# Patient Record
Sex: Female | Born: 1990 | Race: White | Hispanic: No | Marital: Single | Smoking: Never smoker
Health system: Southern US, Community
[De-identification: ages and names within clinical notes are randomized; demographics above are authoritative.]

## PROBLEM LIST (undated history)

## (undated) ENCOUNTER — Inpatient Hospital Stay (HOSPITAL_COMMUNITY): Payer: Self-pay

## (undated) DIAGNOSIS — N200 Calculus of kidney: Secondary | ICD-10-CM

## (undated) DIAGNOSIS — I499 Cardiac arrhythmia, unspecified: Secondary | ICD-10-CM

## (undated) DIAGNOSIS — Z8619 Personal history of other infectious and parasitic diseases: Secondary | ICD-10-CM

## (undated) DIAGNOSIS — E739 Lactose intolerance, unspecified: Secondary | ICD-10-CM

## (undated) DIAGNOSIS — R87629 Unspecified abnormal cytological findings in specimens from vagina: Secondary | ICD-10-CM

## (undated) DIAGNOSIS — IMO0002 Reserved for concepts with insufficient information to code with codable children: Secondary | ICD-10-CM

## (undated) HISTORY — DX: Personal history of other infectious and parasitic diseases: Z86.19

## (undated) HISTORY — PX: WISDOM TOOTH EXTRACTION: SHX21

## (undated) HISTORY — DX: Lactose intolerance, unspecified: E73.9

## (undated) HISTORY — DX: Reserved for concepts with insufficient information to code with codable children: IMO0002

## (undated) HISTORY — DX: Cardiac arrhythmia, unspecified: I49.9

## (undated) HISTORY — DX: Unspecified abnormal cytological findings in specimens from vagina: R87.629

---
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
## 2010-08-14 ENCOUNTER — Emergency Department (HOSPITAL_BASED_OUTPATIENT_CLINIC_OR_DEPARTMENT_OTHER): Admission: EM | Admit: 2010-08-14 | Discharge: 2010-08-14 | Payer: Self-pay | Admitting: Emergency Medicine

## 2011-02-21 LAB — URINALYSIS, ROUTINE W REFLEX MICROSCOPIC
Bilirubin Urine: NEGATIVE
Glucose, UA: NEGATIVE mg/dL
Ketones, ur: 15 mg/dL — AB
Protein, ur: 30 mg/dL — AB

## 2011-02-21 LAB — URINE MICROSCOPIC-ADD ON

## 2011-04-23 NOTE — Letter (Signed)
September 29, 2008    C. Duane Lope, MD  [address]  [place], [place] [address]   RE:  Rebecca Christensen  MRN:  [id number]  /  DOB:  [birth year]   Dear Hessie Diener:   It was a pleasure to see Rebecca Christensen today at your request and I am  sorry I did not get a chance to speak with you about her, but I will  afford to do that tomorrow.   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  One of these episodes recently are brought her to your office; the  tachycardia.  Unfortunately, terminated just prior to being brought  back.  An electrocardiogram we have from your office demonstrated sinus  rhythm; however, what she describes is abrupt in onset and offset that  lasted on this occasion 20-30 minutes.   It has most severe symptoms at this initiation with lightheadedness and  presyncope whether she is sitting and/or standing.  Symptoms abate  gradually over the duration.  The frequency of these episodes has  increased from initially an interval of couple of years to now only a  couple of months.  I should note that they are frog negative and  diuretic negative.   She does not have any symptoms of orthostatic intolerance, heat  intolerance, or shower intolerance to suggest a dysautonomic process.   PAST MEDICAL HISTORY:  Notable for sweating in her hands and her feet  for which he takes Robinul Forte.  She does not take in a great deal of  liquids during the day.  Her review of systems is otherwise broadly  negative.   She is unmarried.  She has an older brother.  She does not use  cigarettes, alcohol, or recreational drugs.  She does use some caffeine,  but has tried to cut back on this since she saw you.   FAMILY HISTORY:  Noncontributory.   PHYSICAL EXAMINATION:  GENERAL:  She is an attractive young lady who  appears at her stated age of 20.  VITAL SIGNS:  Her blood pressure is 100/70.  Her pulse was 80.  There is  no orthostatic change with prolonged standing.  HEENT:  No icterus, no xanthoma.  NECK:  Veins were flat.  The carotids are brisk and full bilaterally  without bruits.  BACK:  Without kyphosis, scoliosis.  LUNGS:  Clear.  HEART:  Sounds were regular without murmurs or gallops.  ABDOMEN:  Soft with active bowel sounds without midline pulsation or  hepatomegaly.  Femoral pulses were 2+, distal pulses were intact.  There  is no clubbing, cyanosis, or edema.  NEUROLOGICAL:  Grossly normal.  SKIN:  Warm and dry.   Electrocardiogram dated today demonstrated sinus rhythm of 80 with  intervals of 0.12/0.0 9/0.37, the axis was 71 degrees.   IMPRESSION:  1. 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  2. Short PR interval without evidence of ventricular pre-excitation.  3. Presyncope associated with supraventricular tachycardia.   Rebecca Christensen probably has SVT as a cause of her symptoms and probably AV  reentry.  We concealed accessory pathway.  Notwithstanding her short PR,  there is no evidence of ventricular pre-excitation which almost always  is associated with a positive delta wave in  lead V4.  The absence of the  delta wave is important in terms of associated risks of sudden cardiac  death that are known to accompany WPW.   The other major issue here is the profound symptoms of presyncope that  she has whether she is standing or sitting, the latter being relevant to  driving.  Symptoms are worse typically at the onset because of  A.  Brief relative decrease in venous return resulting in modest under  filling, resulting in decreased cardiac output.  B.  Secondary neurally mediated vaso dilatation.  Her volume deplete  diet may help remediate some of that, but I still am concerned about the  frequency of her symptoms and the degree of  lightheadedness that  accompanies the onset.   We discussed treatment options including doing nothing apart from fluid  resuscitation.  1. Doing nothing apart from extra fluid.  2. P.r.n. or daily medications, the former making not a lot of sense      giving the severity of her symptoms are worse at onset and the      latter being some concern because of a still relative infrequency.  3. Catheter ablation with EP testing.  We discussed the potential      benefits and risks of this including, but not limited to death,      perforation, and heart block requiring pacemaker implantation.   We gave them information from the [employer] [facility] to  review as a family and we will plan to sit down with them again in about  2-3 months' time to see how it is that she is doing with the treatment  plan.   Thanks very much for asking [place] to see her.    Sincerely,      Duke Salvia, MD, [facility]  Electronically Signed    SCK/MedQ  DD: 09/29/2008  DT: 09/30/2008  Job #: [id number]

## 2011-07-11 LAB — GC/CHLAMYDIA PROBE AMP, GENITAL

## 2011-07-11 LAB — RUBELLA ANTIBODY, IGM: Rubella: IMMUNE

## 2011-09-09 ENCOUNTER — Inpatient Hospital Stay (HOSPITAL_COMMUNITY)
Admission: AD | Admit: 2011-09-09 | Discharge: 2011-09-09 | Disposition: A | Discharge status: Home / Self Care | Payer: Medicaid Other | Source: Ambulatory Visit | Attending: Obstetrics and Gynecology | Admitting: Obstetrics and Gynecology

## 2011-09-09 ENCOUNTER — Inpatient Hospital Stay (HOSPITAL_COMMUNITY): Payer: Medicaid Other

## 2011-09-09 ENCOUNTER — Encounter (HOSPITAL_COMMUNITY): Payer: Self-pay | Admitting: *Deleted

## 2011-09-09 DIAGNOSIS — R109 Unspecified abdominal pain: Secondary | ICD-10-CM | POA: Insufficient documentation

## 2011-09-09 DIAGNOSIS — R319 Hematuria, unspecified: Secondary | ICD-10-CM | POA: Insufficient documentation

## 2011-09-09 DIAGNOSIS — N200 Calculus of kidney: Secondary | ICD-10-CM

## 2011-09-09 DIAGNOSIS — O99891 Other specified diseases and conditions complicating pregnancy: Secondary | ICD-10-CM | POA: Insufficient documentation

## 2011-09-09 HISTORY — PX: RENAL ARTERY STENT: SHX2321

## 2011-09-09 HISTORY — DX: Calculus of kidney: N20.0

## 2011-09-09 LAB — URINALYSIS, ROUTINE W REFLEX MICROSCOPIC
Leukocytes, UA: NEGATIVE
Nitrite: NEGATIVE
Protein, ur: NEGATIVE mg/dL
Specific Gravity, Urine: >1.03 — ABNORMAL HIGH (ref 1.005–1.030)
Urobilinogen, UA: 0.2 mg/dL (ref 0.0–1.0)

## 2011-09-09 LAB — URINE MICROSCOPIC-ADD ON

## 2011-09-09 MED ORDER — OXYCODONE-ACETAMINOPHEN 5-325 MG PO TABS
2.0000 | ORAL_TABLET | Freq: Once | ORAL | Status: AC
  Administered 2011-09-09: 2 via ORAL
  Filled 2011-09-09: qty 2

## 2011-09-09 MED ORDER — HYDROMORPHONE HCL 1 MG/ML IJ SOLN
1.0000 mg | Freq: Once | INTRAMUSCULAR | Status: AC
  Administered 2011-09-09: 1 mg via INTRAVENOUS
  Filled 2011-09-09: qty 1

## 2011-09-09 MED ORDER — ONDANSETRON HCL 4 MG/2ML IJ SOLN
4.0000 mg | Freq: Once | INTRAMUSCULAR | Status: AC
  Administered 2011-09-09: 4 mg via INTRAVENOUS
  Filled 2011-09-09: qty 2

## 2011-09-09 MED ORDER — LACTATED RINGERS IV SOLN
INTRAVENOUS | Status: DC
  Administered 2011-09-09: 06:00:00 via INTRAVENOUS

## 2011-09-09 MED ORDER — OXYCODONE-ACETAMINOPHEN 5-325 MG PO TABS: 1.0000 | ORAL_TABLET | ORAL | Status: AC | PRN

## 2011-09-09 NOTE — Progress Notes (Signed)
Pt states she feels like she has a kidney stone

## 2011-09-09 NOTE — ED Provider Notes (Signed)
History     Chief Complaint  Patient presents with  . Abdominal Pain   HPI  Pt is [redacted] weeks pregnant and woke up at 1 am with right flank pain like she has a kidney stone.  At 3:30am she started vomiting. Her pain is 9/10.  Pt had a kidneystone a year ago and was seen at [facility] on 68- she was able to pass it on her own.  She says she normally has kidney stones on her left side.    Past Medical History  Diagnosis Date  . Kidney stones     No past surgical history on file.  Family History  Problem Relation Age of Onset  . Hyperlipidemia Mother     History  Substance Use Topics  . Smoking status: Never Smoker   . Smokeless tobacco: Never Used  . Alcohol Use: No    Allergies: No Known Allergies  Prescriptions prior to admission  Medication Sig Dispense Refill  . prenatal vitamin w/FE, FA (PRENATAL 1 + 1) 27-1 MG TABS Take 1 tablet by mouth daily.          Review of Systems  Constitutional: Negative for fever and chills.  Gastrointestinal: Positive for nausea and vomiting.  Genitourinary: Positive for flank pain. Negative for dysuria and urgency.   Physical Exam   Blood pressure 93/56, pulse 87, temperature 98.4 F (36.9 C), temperature source Oral, resp. rate 16, height 5\' 1"  (1.549 m), weight 117 lb (53.071 kg).  Physical Exam  Vitals reviewed. Constitutional: She appears well-developed and well-nourished. She appears distressed.  HENT:  Head: Normocephalic.  Eyes: Pupils are equal, round, and reactive to light.  Neck: Normal range of motion. Neck supple.  Respiratory: Effort normal.  GI: Soft. There is CVA tenderness.       Right CVA tenderness     MAU Course  Procedures IV hydration with LR Pain med with IV Dilaudid 1 mg Antiemetic Zofran 4mg  IV Pt's pain went down to 1/10 and nausea almost completely gone Dr. Arelia Sneddon consulted- will get renal ultrasound Urinalysis showed hematuria and calcium oxylate crystals  Care handed over to  [facility], [facility] Assessment and Plan    LINEBERRY,SUSAN 09/09/2011, 7:21 AM

## 2011-09-09 NOTE — ED Provider Notes (Signed)
History   I have assumed care of this pt from Pamelia Hoit, FNP. Please see previous H&P. Pt with suspected renal calculi.  Chief Complaint  Patient presents with  . Abdominal Pain   HPI  OB History    Grav Para Term Preterm Abortions TAB SAB Ect Mult Living   1               Past Medical History  Diagnosis Date  . Kidney stones     No past surgical history on file.  Family History  Problem Relation Age of Onset  . Hyperlipidemia Mother     History  Substance Use Topics  . Smoking status: Never Smoker   . Smokeless tobacco: Never Used  . Alcohol Use: No    Allergies: No Known Allergies  Prescriptions prior to admission  Medication Sig Dispense Refill  . prenatal vitamin w/FE, FA (PRENATAL 1 + 1) 27-1 MG TABS Take 1 tablet by mouth daily.          ROS Physical Exam   Blood pressure 100/62, pulse 83, temperature 99.1 F (37.3 C), temperature source Oral, resp. rate 16, height 5\' 1"  (1.549 m), weight 117 lb (53.071 kg).  Physical Exam  MAU Course  Procedures  Results for orders placed during the hospital encounter of 09/09/11 (from the past 24 hour(s))  URINALYSIS, ROUTINE W REFLEX MICROSCOPIC     Status: Abnormal   Collection Time   09/09/11  5:10 AM      Component Value Range   Color, Urine YELLOW  YELLOW    Appearance HAZY (*) CLEAR    Specific Gravity, Urine >1.030 (*) 1.005 - 1.030    pH 6.0  5.0 - 8.0    Glucose, UA NEGATIVE  NEGATIVE (mg/dL)   Hgb urine dipstick LARGE (*) NEGATIVE    Bilirubin Urine NEGATIVE  NEGATIVE    Ketones, ur NEGATIVE  NEGATIVE (mg/dL)   Protein, ur NEGATIVE  NEGATIVE (mg/dL)   Urobilinogen, UA 0.2  0.0 - 1.0 (mg/dL)   Nitrite NEGATIVE  NEGATIVE    Leukocytes, UA NEGATIVE  NEGATIVE   URINE MICROSCOPIC-ADD ON     Status: Abnormal   Collection Time   09/09/11  5:10 AM      Component Value Range   Squamous Epithelial / LPF FEW (*) RARE    WBC, UA 0-2  <3 (WBC/hpf)   RBC / HPF 11-20  <3 (RBC/hpf)   Bacteria, UA RARE   RARE    Crystals CA OXALATE CRYSTALS (*) NEGATIVE    US Renal  09/09/2011  *RADIOLOGY REPORT*  Clinical Data:  Right flank pain.  Urolithiasis.  [redacted] weeks pregnant.  RENAL/URINARY TRACT ULTRASOUND COMPLETE  Comparison:  None.  Findings:  Right Kidney:  Normal in size and parenchymal echogenicity.  No evidence of mass or hydronephrosis.  Left Kidney:  Normal in size and parenchymal echogenicity.  No evidence of mass or hydronephrosis.  Bladder:  Appears normal for degree of bladder distention.  IMPRESSION: Normal study.  No evidence of hydronephrosis or other significant abnormality.  Original Report Authenticated By: Danae Orleans, M.D.   Discussed pt with Dr. Vincente Poli at length. Will dc to home with Rx for pain meds and give urine strainer. She is to f/u in office.  Assessment and Plan  Rt flank pain/hematuria: pt likely with renal calculi. No sign of obstruction at this time. Will dc to home with Rx for percocet and flomax. She will f/u with Dr. Lynnell Dike office this week.  Discussed diet, activity, risks, and precautions.  Clinton Gallant. Rice III, DrHSc, MPAS, PA-C  09/09/2011, 8:16 AM   Henrietta Hoover, PA 09/09/11 0825

## 2011-09-26 ENCOUNTER — Encounter (HOSPITAL_COMMUNITY): Payer: Self-pay | Admitting: *Deleted

## 2011-09-26 ENCOUNTER — Inpatient Hospital Stay (HOSPITAL_COMMUNITY)
Admission: AD | Admit: 2011-09-26 | Discharge: 2011-09-26 | Disposition: A | Discharge status: Home / Self Care | Payer: Medicaid Other | Source: Ambulatory Visit | Attending: Obstetrics and Gynecology | Admitting: Obstetrics and Gynecology

## 2011-09-26 DIAGNOSIS — O99891 Other specified diseases and conditions complicating pregnancy: Secondary | ICD-10-CM | POA: Insufficient documentation

## 2011-09-26 DIAGNOSIS — N200 Calculus of kidney: Secondary | ICD-10-CM | POA: Insufficient documentation

## 2011-09-26 DIAGNOSIS — R109 Unspecified abdominal pain: Secondary | ICD-10-CM | POA: Insufficient documentation

## 2011-09-26 LAB — URINALYSIS, ROUTINE W REFLEX MICROSCOPIC
Bilirubin Urine: NEGATIVE
Leukocytes, UA: NEGATIVE
Nitrite: NEGATIVE
Specific Gravity, Urine: 1.025 (ref 1.005–1.030)
pH: 6 (ref 5.0–8.0)

## 2011-09-26 MED ORDER — BUTORPHANOL TARTRATE 2 MG/ML IJ SOLN
1.0000 mg | Freq: Once | INTRAMUSCULAR | Status: AC
  Administered 2011-09-26: 1 mg via INTRAMUSCULAR
  Filled 2011-09-26: qty 1

## 2011-09-26 MED ORDER — OXYCODONE-ACETAMINOPHEN 5-325 MG PO TABS: 1.0000 | ORAL_TABLET | ORAL | Status: AC | PRN

## 2011-09-26 MED ORDER — PROMETHAZINE HCL 25 MG/ML IJ SOLN
25.0000 mg | Freq: Once | INTRAMUSCULAR | Status: AC
  Administered 2011-09-26: 25 mg via INTRAMUSCULAR
  Filled 2011-09-26: qty 1

## 2011-09-26 NOTE — Progress Notes (Signed)
Pt denies vaginal bleeding but states she had bleed after she passed first stone. Pt denies pain on urination.Pt states she has pain in left side.

## 2011-09-26 NOTE — Progress Notes (Signed)
Pt states she woke up at 0600 and past first stone this AM, pasted a second stone at 900

## 2011-09-26 NOTE — ED Provider Notes (Signed)
History   Pt presents today c/o passing multiple renal calculi. She states she has passed at least 2 stones today and is having left flank pain that comes in waves. She denies vag bleeding, dc, fever. She does report some hematuria immediately following passing the stones. She reports GFM.  Chief Complaint  Patient presents with  . Nephrolithiasis   HPI  OB History    Grav Para Term Preterm Abortions TAB SAB Ect Mult Living   1               Past Medical History  Diagnosis Date  . Kidney stones     History reviewed. No pertinent past surgical history.  Family History  Problem Relation Age of Onset  . Hyperlipidemia Mother     History  Substance Use Topics  . Smoking status: Never Smoker   . Smokeless tobacco: Never Used  . Alcohol Use: No    Allergies: No Known Allergies  Prescriptions prior to admission  Medication Sig Dispense Refill  . prenatal vitamin w/FE, FA (PRENATAL 1 + 1) 27-1 MG TABS Take 1 tablet by mouth daily.          Review of Systems  Constitutional: Negative for fever.  Eyes: Negative for blurred vision.  Cardiovascular: Negative for chest pain and palpitations.  Gastrointestinal: Positive for nausea, vomiting and abdominal pain. Negative for diarrhea, constipation and blood in stool.  Genitourinary: Positive for hematuria and flank pain. Negative for dysuria, urgency and frequency.  Neurological: Negative for dizziness and headaches.  Psychiatric/Behavioral: Negative for depression and suicidal ideas.   Physical Exam   Blood pressure 109/61, pulse 110, temperature 98 F (36.7 C), temperature source Oral, resp. rate 18.  Physical Exam  Constitutional: She is oriented to person, place, and time. She appears well-developed and well-nourished. No distress.  HENT:  Head: Normocephalic and atraumatic.  Eyes: EOM are normal. Pupils are equal, round, and reactive to light.  GI: Soft. She exhibits no distension. There is no tenderness. There is no  rebound and no guarding.  Neurological: She is alert and oriented to person, place, and time.  Skin: Skin is warm and dry. She is not diaphoretic.  Psychiatric: She has a normal mood and affect. Her behavior is normal. Judgment and thought content normal.    MAU Course  Procedures  Dr. Marcelle Overlie called. He states he will schedule her to have f/u with urology.    Assessment and Plan  Renal calculi: discussed with pt at length. Will give Rx for percocet. Discussed diet, activity, risks, and precautions. She has f/u scheduled.  Clinton Gallant. Khyan Oats III, DrHSc, MPAS, PA-C  09/26/2011, 3:28 PM   Henrietta Hoover, PA 09/26/11 1651

## 2011-09-28 LAB — URINE CULTURE: Culture  Setup Time: 201210190155

## 2011-09-29 ENCOUNTER — Emergency Department (HOSPITAL_COMMUNITY): Payer: Medicaid Other

## 2011-09-29 ENCOUNTER — Observation Stay (HOSPITAL_COMMUNITY)
Admission: EM | Admit: 2011-09-29 | Discharge: 2011-09-30 | Disposition: A | Discharge status: Home / Self Care | Payer: Medicaid Other | Attending: Urology | Admitting: Urology

## 2011-09-29 DIAGNOSIS — N1330 Unspecified hydronephrosis: Secondary | ICD-10-CM | POA: Insufficient documentation

## 2011-09-29 DIAGNOSIS — O26839 Pregnancy related renal disease, unspecified trimester: Principal | ICD-10-CM | POA: Insufficient documentation

## 2011-09-29 DIAGNOSIS — N200 Calculus of kidney: Secondary | ICD-10-CM | POA: Insufficient documentation

## 2011-09-29 LAB — DIFFERENTIAL
Basophils Absolute: 0 10*3/uL (ref 0.0–0.1)
Eosinophils Absolute: 0.1 10*3/uL (ref 0.0–0.7)
Eosinophils Relative: 1 % (ref 0–5)
Monocytes Absolute: 0.9 10*3/uL (ref 0.1–1.0)

## 2011-09-29 LAB — CBC
MCHC: 35.1 g/dL (ref 30.0–36.0)
MCV: 86.7 fL (ref 78.0–100.0)
Platelets: 219 10*3/uL (ref 150–400)
RDW: 13 % (ref 11.5–15.5)
WBC: 10 10*3/uL (ref 4.0–10.5)

## 2011-09-29 LAB — BASIC METABOLIC PANEL
Chloride: 105 mEq/L (ref 96–112)
Creatinine, Ser: 0.93 mg/dL (ref 0.50–1.10)
GFR calc Af Amer: >90 mL/min (ref >90)
GFR calc non Af Amer: 87 mL/min — ABNORMAL LOW (ref >90)
Potassium: 4.3 mEq/L (ref 3.5–5.1)

## 2011-09-29 LAB — URINALYSIS, ROUTINE W REFLEX MICROSCOPIC
Glucose, UA: NEGATIVE mg/dL
Specific Gravity, Urine: 1.014 (ref 1.005–1.030)
pH: 6.5 (ref 5.0–8.0)

## 2011-09-29 LAB — URINE MICROSCOPIC-ADD ON

## 2011-10-03 NOTE — Op Note (Signed)
  Rebecca Christensen, Rebecca Christensen               ACCOUNT NO.:  [id number]  MEDICAL RECORD NO.:  [id number]  LOCATION:  1430                         FACILITY:  [facility]  PHYSICIAN:  Danae Chen, M.D.  DATE OF BIRTH:  [birth year]  DATE OF PROCEDURE:  09/29/2011 DATE OF DISCHARGE:                              OPERATIVE REPORT   PREOPERATIVE DIAGNOSIS:  Bilateral nephrolithiasis.  POSTOPERATIVE DIAGNOSIS:  Bilateral nephrolithiasis.  PROCEDURE DONE:  Cystoscopy and insertion of bilateral double-J stent.  SURGEON:  Danae Chen, M.D.  ANESTHESIA:  Spinal.  DATE OF PROCEDURE:  September 29, 2011.  INDICATION:  Patient is a 20 year old female, [redacted] weeks pregnant, who passed a right ureteral stone 2 weeks ago.  Three days ago, she started having left-sided abdominal pain and had a renal ultrasound that showed a moderate left hydronephrosis.  She continued to have pain and return to the emergency room today.  A renal ultrasound showed worsening of the left hydronephrosis.  Because of her history of bilateral renal calculi, she was advised to have bilateral double-J stent.  The procedure, the risks and benefits were discussed with the patient.  The risk include, but are not limited to, radiation injury to the fetus, hemorrhage, infection, pain from the stent.  She understands and agrees to proceed. Her boyfriend and her parents were also informed and also agreed to proceed.  The patient was identified by her wrist band and proper time-out was taken.  Under spinal anesthesia, she was prepped and draped and placed in the dorsal lithotomy position.  She was placed on a pelvic shield to minimize radiation exposure to the fetus.  Then a panendoscope was inserted in the bladder.  The bladder mucosa is normal.  There is no stone or tumor in the bladder.  The ureteral orifices are in normal position and shape.  A Sensor wire was passed through the cystoscope and through the left ureteral orifice a #6-24  double-J stent was passed over the Sensor wire. When the distal end of the double-J stent was in the bladder, the wire was removed.  The same procedure was done on the right side.  Then, the bladder was emptied and the cystoscope and guidewire were removed.  A KUB was then obtained to localize the proximal ends of the stents. There was a good curl of the stent in each kidney.  The patient was then placed in the supine position.  She tolerated the procedure well and left the OR in satisfactory condition to Post Anesthesia Care Unit.     Danae Chen, M.D.     MN/MEDQ  D:  09/29/2011  T:  09/29/2011  Job:  [id number]  Electronically Signed by Lindaann Slough M.D. on 10/03/2011 03:49:13 PM

## 2011-10-03 NOTE — Discharge Summary (Signed)
  Rebecca Christensen, Rebecca Christensen               ACCOUNT NO.:  [id number]  MEDICAL RECORD NO.:  [id number]  LOCATION:  1430                         FACILITY:  [facility]  PHYSICIAN:  Danae Chen, M.D.  DATE OF BIRTH:  12/03/1991  DATE OF ADMISSION:  09/29/2011 DATE OF DISCHARGE:  09/30/2011                              DISCHARGE SUMMARY   DISCHARGE DIAGNOSIS:  Bilateral nephrolithiasis.  PROCEDURE DONE:  Cystoscopy and insertion of double-J stent on September 29, 2011.  The patient is a 20 year old female, [redacted] weeks pregnant, who passed a right ureteral stone about 2 weeks ago.  For the past 3-4 days, she started having severe left flank pain.  She was seen in the emergency room on October, 21, 2012, and a renal ultrasound showed moderate left hydronephrosis.  Because of her history of bilateral renal stones, she was advised then to have bilateral stent placement.  Double-J stents were inserted on September 29, 2011.  On October 22, she was afebrile. She was eating well.  She did not have any more nausea.  She did not have any flank pain.  She had frequency.  She was then discharged home on Percocet 5/325, one or two tablets q.4h p.r.n. for pain.  She will be followed by Dr. Isabel Caprice.  CONDITION ON DISCHARGE:  Improved.  DISCHARGED DIET:  Regular.     Danae Chen, M.D.     MN/MEDQ  D:  09/30/2011  T:  09/30/2011  Job:  [id number]  cc:   Rebecca Christensen, M.D. Fax: [phone]  Electronically Signed by Lindaann Slough M.D. on 10/03/2011 03:50:04 PM

## 2011-10-03 NOTE — Consult Note (Signed)
NAMEJULYA, ALIOTO NO.:  [id number]  MEDICAL RECORD NO.:  [id number]  LOCATION:  1430                         FACILITY:  [facility]  PHYSICIAN:  Sebastian Ache, MD     DATE OF BIRTH:  [birth year]  DATE OF CONSULTATION: DATE OF DISCHARGE:                                CONSULTATION   REQUESTING PHYSICIAN:  Nelva Nay, MD  REASON FOR CONSULTATION:  Flank pain, history of nephrolithiasis, hydronephrosis, pregnancy.  HISTORY OF PRESENT ILLNESS:  Rebecca Christensen is a 20 year old female, who is 49 weeks' pregnant with her first child and has had recurrent flank pain and passage of stones.  She has passed 2 stones that she has collected so far this pregnancy, one that was presumably right sided and the other that was presumably left sided.  She states that she now has had 4 days of increasing left flank pain accompanied with nausea and vomiting that has been refractory to an oral medications.  She is unable to maintain hydration.  Ultrasound examination today revealed worsen left hydronephrosis, mild right hydronephrosis compared to a recent prior study.  Patient denies any fever, chills, dysuria, or prior surgical management for stones.  She also denies any currently known problems with her pregnancy.  PAST MEDICAL HISTORY:  Nephrolithiasis.  The patient has passed many stones spontaneously throughout her life, 2 during her pregnancy.  PAST SURGICAL HISTORY:  None.  FAMILY HISTORY:  Mother with nephrolithiasis, that is recurrent.  SOCIAL HISTORY:  Nontender and nonsmoker.  She uses no illicit substances.  REVIEW OF SYSTEMS:  PSYCHIATRIC:  Denies changes in mood.  PULMONARY: Denies shortness of breath.  CARDIAC:  Denies chest tightness. GASTROINTESTINAL:  Admits to poor PO intake with nausea and vomiting. GENITOURINARY:  Admits to flank pain and history of nephrolithiasis. REPRODUCTIVE:  Admits to 22-week pregnancy without known problems. INTEGUMENTARY:  Denies  rashes or lesions.  MUSCULOSKELETAL:  Denies decreased range of motion.  MEDICATIONS:  Percocet.  MEDICATION ALLERGIES:  None.  PHYSICAL EXAMINATION:  VITAL SIGNS:  Temperature 98.6, pulse 106, blood pressure 103/70. GENERAL:  Rebecca Christensen is a pleasant, young female, who appears stated age. She is accompanied by her mother and additional family. HEENT EXAM:  Normocephalic, atraumatic. CARDIAC EXAM:  Regular rhythm. PULMONARY EXAM:  Clear to auscultation. ABDOMINAL EXAM:  Soft, nontender, and nondistended.  She has a gravid uterus consistent approximately 20 to 24-week gestation.  There is mild left CVA tenderness. EXTREMITIES:  No cord cyanosis or edema. SKIN EXAM:  No rashes noted. PSYCHIATRIC EXAM:  The patient is euthymic.  LABORATORY INVESTIGATIONS:  I independently reviewed the patient's basic metabolic profile and found it within normal limits.  I have also independently reviewed the patient's CBC and found it within normal limits.  A urinalysis is reviewed and is significant for microhematuria and small leukocyte esterase.  IMAGING:  I independently reviewed ultrasound images, obtained today, and compared them to prior study on October 1, and found significantly worsened left hydronephrosis, that is moderate in nature as well as right hydronephrosis, that is moderate in nature.  ASSESSMENT:  The patient is a 20 year old female, who is 85 weeks' pregnant with a history of multiple stone passage episodes  with left flank pain, nausea, and vomiting as refractory to conservative measures and worrisome for renal colic.  PLAN:  Left flank pain, history of stones.  We discussed possible etiologies of her pain including likely nephrolithiasis versus gestational hydronephrosis.  We discussed treatment options including continued conservative measures with hydration and oral medications versus surgical therapy with referred modality being stenting and stent changes, which will need  to be performed every 4 to 6 weeks throughout the remainder of the pregnancy.  We discussed surgery in detail.  In fact that the images provided may recommend conscious sedation, which we would find acceptable.  We also discussed the anesthetic risks and surgical risks in terms of her pregnancy as well as risks including damage to the kidney, ureter, and bladder; failure to relieve obstruction; need for further procedure such as nephrostomy.  Patient has been NPO since yesterday and she wishes to proceed.  Informed consent has been obtained and placed on the medical record for assist in bilateral stenting.  We decided to go with bilateral stenting as the patient has had symptoms consistent with both sided stones during this pregnancy as well as having bilateral hydronephrosis.          ______________________________ Sebastian Ache, MD     TM/MEDQ  D:  09/29/2011  T:  09/30/2011  Job:  [id number]  Electronically Signed by Barron Alvine M.D. on 10/03/2011 12:46:56 PM

## 2011-12-08 ENCOUNTER — Encounter (HOSPITAL_COMMUNITY): Payer: Self-pay | Admitting: *Deleted

## 2011-12-08 ENCOUNTER — Inpatient Hospital Stay (HOSPITAL_COMMUNITY)
Admission: AD | Admit: 2011-12-08 | Discharge: 2011-12-08 | Disposition: A | Discharge status: Home / Self Care | Payer: Medicaid Other | Source: Ambulatory Visit | Attending: Obstetrics and Gynecology | Admitting: Obstetrics and Gynecology

## 2011-12-08 ENCOUNTER — Inpatient Hospital Stay (HOSPITAL_COMMUNITY): Payer: Medicaid Other

## 2011-12-08 DIAGNOSIS — O99891 Other specified diseases and conditions complicating pregnancy: Secondary | ICD-10-CM | POA: Insufficient documentation

## 2011-12-08 DIAGNOSIS — Z3480 Encounter for supervision of other normal pregnancy, unspecified trimester: Secondary | ICD-10-CM

## 2011-12-08 DIAGNOSIS — N1330 Unspecified hydronephrosis: Secondary | ICD-10-CM | POA: Insufficient documentation

## 2011-12-08 DIAGNOSIS — R109 Unspecified abdominal pain: Secondary | ICD-10-CM | POA: Insufficient documentation

## 2011-12-08 HISTORY — DX: Calculus of kidney: N20.0

## 2011-12-08 LAB — COMPREHENSIVE METABOLIC PANEL
AST: 13 U/L (ref 0–37)
BUN: 12 mg/dL (ref 6–23)
CO2: 23 mEq/L (ref 19–32)
Calcium: 8.9 mg/dL (ref 8.4–10.5)
Chloride: 102 mEq/L (ref 96–112)
Creatinine, Ser: 0.49 mg/dL — ABNORMAL LOW (ref 0.50–1.10)
GFR calc Af Amer: >90 mL/min (ref >90)
GFR calc non Af Amer: >90 mL/min (ref >90)
Glucose, Bld: 93 mg/dL (ref 70–99)
Total Bilirubin: 0.2 mg/dL — ABNORMAL LOW (ref 0.3–1.2)

## 2011-12-08 LAB — URINALYSIS, ROUTINE W REFLEX MICROSCOPIC
Bilirubin Urine: NEGATIVE
Ketones, ur: NEGATIVE mg/dL
Leukocytes, UA: NEGATIVE
Nitrite: NEGATIVE
Urobilinogen, UA: 0.2 mg/dL (ref 0.0–1.0)
pH: 6.5 (ref 5.0–8.0)

## 2011-12-08 LAB — CBC
HCT: 29.2 % — ABNORMAL LOW (ref 36.0–46.0)
Hemoglobin: 9.8 g/dL — ABNORMAL LOW (ref 12.0–15.0)
MCH: 28.5 pg (ref 26.0–34.0)
MCV: 84.9 fL (ref 78.0–100.0)
RBC: 3.44 MIL/uL — ABNORMAL LOW (ref 3.87–5.11)
WBC: 11.1 10*3/uL — ABNORMAL HIGH (ref 4.0–10.5)

## 2011-12-08 LAB — URINE MICROSCOPIC-ADD ON

## 2011-12-08 MED ORDER — HYDROMORPHONE HCL PF 1 MG/ML IJ SOLN
1.0000 mg | Freq: Once | INTRAMUSCULAR | Status: AC
  Administered 2011-12-08: 1 mg via INTRAVENOUS
  Filled 2011-12-08: qty 1

## 2011-12-08 MED ORDER — TERBUTALINE SULFATE 1 MG/ML IJ SOLN
0.2500 mg | Freq: Once | INTRAMUSCULAR | Status: AC
  Administered 2011-12-08: 0.25 mg via SUBCUTANEOUS
  Filled 2011-12-08: qty 1

## 2011-12-08 MED ORDER — LACTATED RINGERS IV SOLN
Freq: Once | INTRAVENOUS | Status: AC
  Administered 2011-12-08: 02:00:00 via INTRAVENOUS

## 2011-12-08 MED ORDER — METOCLOPRAMIDE HCL 5 MG/ML IJ SOLN
10.0000 mg | Freq: Once | INTRAMUSCULAR | Status: AC
  Administered 2011-12-08: 10 mg via INTRAVENOUS
  Filled 2011-12-08: qty 2

## 2011-12-08 MED ORDER — OXYCODONE-ACETAMINOPHEN 5-325 MG PO TABS: 1.0000 | ORAL_TABLET | ORAL | Status: AC | PRN

## 2011-12-08 NOTE — Progress Notes (Signed)
Written and verbal d/c instructions given and understanding voiced. 

## 2011-12-08 NOTE — ED Notes (Signed)
W. Muhammed CNM in to see pt 

## 2011-12-08 NOTE — Consult Note (Signed)
Consulted with Dr. Modesto Charon HPI, exam, labs, ultrasound, cervical exam>give Terbutaline

## 2011-12-08 NOTE — ED Notes (Signed)
Pt vomited for 3rd time. Will try something for nausea now. Ice chips to pt

## 2011-12-08 NOTE — Progress Notes (Signed)
Pt cervix 1/25/-1; no change; reports increased nausea/vomiting

## 2011-12-08 NOTE — ED Notes (Signed)
0530 W. Muhammed CNM in to see pt

## 2011-12-08 NOTE — ED Provider Notes (Signed)
History     Chief Complaint  Patient presents with  . Nephrolithiasis   HPI  Pt is here with report of right sided flank pain that started yesterday and increased in intensity at 2200 last night.  Denies hematuria or fever.  Denies contractions, vaginal bleeding, or leaking of fluid.  Last intercourse last night.    Past Medical History  Diagnosis Date  . Kidney stones   . Kidney calculi     Past Surgical History  Procedure Date  . Renal artery stent 10/12    Removed after 1 month    Family History  Problem Relation Age of Onset  . Hyperlipidemia Mother   . Anesthesia problems Neg Hx   . Hypotension Neg Hx   . Malignant hyperthermia Neg Hx   . Pseudochol deficiency Neg Hx     History  Substance Use Topics  . Smoking status: Never Smoker   . Smokeless tobacco: Never Used  . Alcohol Use: No    Allergies: No Known Allergies  Prescriptions prior to admission  Medication Sig Dispense Refill  . prenatal vitamin w/FE, FA (PRENATAL 1 + 1) 27-1 MG TABS Take 1 tablet by mouth daily.        Marland Kitchen acetaminophen (TYLENOL) 500 MG tablet Take 1,000 mg by mouth every 6 (six) hours as needed. Patient used this medication for kidney stone pain.         Review of Systems  Genitourinary: Positive for flank pain.       Pelvic pressure  All other systems reviewed and are negative.   Physical Exam   Blood pressure 104/63, pulse 102, temperature 98.8 F (37.1 C), temperature source Oral, resp. rate 20, height 5\' 1"  (1.549 m), weight 55.52 kg (122 lb 6.4 oz), SpO2 97.00%.  Physical Exam  Constitutional: She is oriented to person, place, and time. She appears well-developed and well-nourished. She appears distressed ( uncomfortable).  HENT:  Head: Normocephalic.  Neck: Normal range of motion. Neck supple.  Cardiovascular: Normal rate, regular rhythm and normal heart sounds.   Respiratory: Effort normal and breath sounds normal.  GI: Soft. There is no tenderness. There is CVA  tenderness (right).  Genitourinary: No bleeding around the vagina. Vaginal discharge (mucusy) found.       1/25/-1  Neurological: She is alert and oriented to person, place, and time.  Skin: Skin is warm and dry.    MAU Course  Procedures  IV LR IV Dilaudid   [place]: Right renal hydronephrosis with urine flow jet demonstrated  distally. Changes are nonspecific but likely to be due to  extrinsic compression from the pregnancy. No stones are visualized  but ultrasound is not sensitive for detection of renal stones. No  left renal hydronephrosis.  Results for orders placed during the hospital encounter of 12/08/11 (from the past 24 hour(s))  URINALYSIS, ROUTINE W REFLEX MICROSCOPIC     Status: Abnormal   Collection Time   12/08/11  1:36 AM      Component Value Range   Color, Urine YELLOW  YELLOW    APPearance CLEAR  CLEAR    Specific Gravity, Urine 1.020  1.005 - 1.030    pH 6.5  5.0 - 8.0    Glucose, UA NEGATIVE  NEGATIVE (mg/dL)   Hgb urine dipstick TRACE (*) NEGATIVE    Bilirubin Urine NEGATIVE  NEGATIVE    Ketones, ur NEGATIVE  NEGATIVE (mg/dL)   Protein, ur NEGATIVE  NEGATIVE (mg/dL)   Urobilinogen, UA 0.2  0.0 -  1.0 (mg/dL)   Nitrite NEGATIVE  NEGATIVE    Leukocytes, UA NEGATIVE  NEGATIVE   URINE MICROSCOPIC-ADD ON     Status: Abnormal   Collection Time   12/08/11  1:36 AM      Component Value Range   Squamous Epithelial / LPF MANY (*) RARE    WBC, UA 3-6  <3 (WBC/hpf)   RBC / HPF 0-2  <3 (RBC/hpf)   Bacteria, UA FEW (*) RARE    Urine-Other AMORPHOUS URATES/PHOSPHATES    CBC     Status: Abnormal   Collection Time   12/08/11  2:23 AM      Component Value Range   WBC 11.1 (*) 4.0 - 10.5 (K/uL)   RBC 3.44 (*) 3.87 - 5.11 (MIL/uL)   Hemoglobin 9.8 (*) 12.0 - 15.0 (g/dL)   HCT 04.5 (*) 40.9 - 46.0 (%)   MCV 84.9  78.0 - 100.0 (fL)   MCH 28.5  26.0 - 34.0 (pg)   MCHC 33.6  30.0 - 36.0 (g/dL)   RDW 81.1  91.4 - 78.2 (%)   Platelets 208  150 - 400 (K/uL)    COMPREHENSIVE METABOLIC PANEL     Status: Abnormal   Collection Time   12/08/11  2:23 AM      Component Value Range   Sodium 134 (*) 135 - 145 (mEq/L)   Potassium 3.9  3.5 - 5.1 (mEq/L)   Chloride 102  96 - 112 (mEq/L)   CO2 23  19 - 32 (mEq/L)   Glucose, Bld 93  70 - 99 (mg/dL)   BUN 12  6 - 23 (mg/dL)   Creatinine, Ser 9.56 (*) 0.50 - 1.10 (mg/dL)   Calcium 8.9  8.4 - 21.3 (mg/dL)   Total Protein 6.5  6.0 - 8.3 (g/dL)   Albumin 2.7 (*) 3.5 - 5.2 (g/dL)   AST 13  0 - 37 (U/L)   ALT 8  0 - 35 (U/L)   Alkaline Phosphatase 101  39 - 117 (U/L)   Total Bilirubin 0.2 (*) 0.3 - 1.2 (mg/dL)   GFR calc non Af Amer >90  >90 (mL/min)   GFR calc Af Amer >90  >90 (mL/min)    Terbutaline 0.25 SQ  Assessment and Plan  Right Flank Pain  Plan: DC to home Increase fluids Follow-up next week RX Percocet Preterm labor precautions.  [facility] 12/08/2011, 2:04 AM

## 2011-12-08 NOTE — Progress Notes (Signed)
Pt dozing.

## 2011-12-08 NOTE — Progress Notes (Signed)
Pt reports improvement in flank pain; denies feeling contractions.

## 2011-12-08 NOTE — Consult Note (Signed)
Reviewed pt cervical recheck (no change)>may dc home with percocet and follow-up in office next week.

## 2011-12-08 NOTE — ED Notes (Signed)
Threasa Heads CNM in see pt

## 2011-12-08 NOTE — Progress Notes (Signed)
G1 at 32.2wks. Pain R side of abdomen all day. Pain would come and go. Pain got worse about 9-10p. Dull cramping R side of stomach which intensifies at times. Radiates to R flank

## 2011-12-08 NOTE — ED Notes (Signed)
Pt vomited x 1. Does not want anything for nausea at this time.

## 2011-12-30 LAB — STREP B DNA PROBE: GBS: NEGATIVE

## 2011-12-31 ENCOUNTER — Inpatient Hospital Stay (HOSPITAL_COMMUNITY)
Admission: AD | Admit: 2011-12-31 | Discharge: 2012-01-01 | Disposition: A | Discharge status: Home / Self Care | Payer: Medicaid Other | Source: Ambulatory Visit | Attending: Obstetrics and Gynecology | Admitting: Obstetrics and Gynecology

## 2011-12-31 ENCOUNTER — Encounter (HOSPITAL_COMMUNITY): Payer: Self-pay | Admitting: *Deleted

## 2011-12-31 DIAGNOSIS — O4700 False labor before 37 completed weeks of gestation, unspecified trimester: Secondary | ICD-10-CM | POA: Insufficient documentation

## 2011-12-31 NOTE — Progress Notes (Signed)
Pt reports contractions since 1900, worsening and getting closer together over the last 2 hours. Denies bleeding. Increase in vaginal discharge today

## 2011-12-31 NOTE — Progress Notes (Signed)
Pt states she has been having contractions. States they were every 5 mins but not sure now because she hasn't really been paying them any attention.

## 2012-01-01 NOTE — ED Provider Notes (Signed)
History     Chief Complaint  Patient presents with  . Labor Eval   HPI 21 y.o. G1P0 at [redacted]w[redacted]d with contractions x a few hours, earlier they were q 5 minutes, now states she doesn't really feel them. No bleeding or LOF, + fetal movement. Cervix 1.5 cm yesterday in office.    Past Medical History  Diagnosis Date  . Kidney stones   . Kidney calculi     Past Surgical History  Procedure Date  . Renal artery stent 10/12    Removed after 1 month  . Wisdom tooth extraction     Family History  Problem Relation Age of Onset  . Hyperlipidemia Mother   . Anesthesia problems Neg Hx   . Hypotension Neg Hx   . Malignant hyperthermia Neg Hx   . Pseudochol deficiency Neg Hx     History  Substance Use Topics  . Smoking status: Never Smoker   . Smokeless tobacco: Never Used  . Alcohol Use: No    Allergies: No Known Allergies  Prescriptions prior to admission  Medication Sig Dispense Refill  . acetaminophen (TYLENOL) 500 MG tablet Take 1,000 mg by mouth every 6 (six) hours as needed. Patient used this medication for kidney stone pain.       . prenatal vitamin w/FE, FA (PRENATAL 1 + 1) 27-1 MG TABS Take 1 tablet by mouth daily.          Review of Systems  Constitutional: Negative.   Respiratory: Negative.   Cardiovascular: Negative.   Gastrointestinal: Negative for nausea, vomiting, abdominal pain, diarrhea and constipation.  Genitourinary: Negative for dysuria, urgency, frequency, hematuria and flank pain.       Negative for vaginal bleeding, Positive for contractions  Musculoskeletal: Negative.   Neurological: Negative.   Psychiatric/Behavioral: Negative.    Physical Exam   Blood pressure 109/77, pulse 102, temperature 98.6 F (37 C), resp. rate 18, height 5\' 1"  (1.549 m), weight 123 lb (55.792 kg), SpO2 98.00%.  Physical Exam  Nursing note and vitals reviewed. Constitutional: She is oriented to person, place, and time. She appears well-developed and well-nourished. No  distress.  Cardiovascular: Normal rate and regular rhythm.   Respiratory: Effort normal.  GI: Soft. There is no tenderness.  Genitourinary:       SVE: 1/50/-3  Musculoskeletal: Normal range of motion.  Neurological: She is alert and oriented to person, place, and time.  Skin: Skin is warm and dry.  Psychiatric: She has a normal mood and affect.   EFM reactive, UC q4-5 min  MAU Course  Procedures   Assessment and Plan  21 y.o. G1P0 at [redacted]w[redacted]d Threatened preterm labor - no signs of active labor D/C home with precautions, f/u as scheduled  Jeanne Diefendorf 01/01/2012, 12:12 AM

## 2012-01-01 NOTE — Progress Notes (Signed)
N. Frazier, CNM at bedside.  Assessment done and poc discussed with pt. VE done.  

## 2012-01-16 ENCOUNTER — Encounter (HOSPITAL_COMMUNITY): Payer: Self-pay | Admitting: *Deleted

## 2012-01-16 ENCOUNTER — Inpatient Hospital Stay (HOSPITAL_COMMUNITY)
Admission: AD | Admit: 2012-01-16 | Discharge: 2012-01-16 | Disposition: A | Discharge status: Home / Self Care | Payer: Medicaid Other | Source: Ambulatory Visit | Attending: Obstetrics and Gynecology | Admitting: Obstetrics and Gynecology

## 2012-01-16 DIAGNOSIS — O479 False labor, unspecified: Secondary | ICD-10-CM | POA: Insufficient documentation

## 2012-01-16 NOTE — Progress Notes (Signed)
Dr. Marcelle Overlie notified of no cervical change after ambulating.  Orders received to dc home.

## 2012-01-16 NOTE — Progress Notes (Signed)
Pt returned from walking at this time. efm and toco replaced.  

## 2012-01-16 NOTE — Progress Notes (Signed)
Pt G1 at 38wks, having contractions every 3-52min.  Denies leaking fluid or bleeding.

## 2012-01-16 NOTE — Progress Notes (Signed)
Dr. Marcelle Overlie notified of pt presenting for labor check.  Notified of VE and ctx pattern.  Order received to walk for 1 hour and return to be rechecked.

## 2012-01-27 ENCOUNTER — Telehealth (HOSPITAL_COMMUNITY): Payer: Self-pay | Admitting: *Deleted

## 2012-01-27 ENCOUNTER — Encounter (HOSPITAL_COMMUNITY): Payer: Self-pay | Admitting: *Deleted

## 2012-01-27 NOTE — Telephone Encounter (Signed)
Preadmission screen  

## 2012-01-28 ENCOUNTER — Inpatient Hospital Stay (HOSPITAL_COMMUNITY): Payer: Medicaid Other | Admitting: Anesthesiology

## 2012-01-28 ENCOUNTER — Encounter (HOSPITAL_COMMUNITY): Payer: Self-pay

## 2012-01-28 ENCOUNTER — Inpatient Hospital Stay (HOSPITAL_COMMUNITY)
Admission: RE | Admit: 2012-01-28 | Discharge: 2012-01-31 | DRG: 766 | Disposition: A | Discharge status: Home / Self Care | Payer: Medicaid Other | Source: Ambulatory Visit | Attending: Obstetrics and Gynecology | Admitting: Obstetrics and Gynecology

## 2012-01-28 ENCOUNTER — Encounter (HOSPITAL_COMMUNITY): Payer: Self-pay | Admitting: Anesthesiology

## 2012-01-28 ENCOUNTER — Other Ambulatory Visit: Payer: Self-pay | Admitting: Obstetrics and Gynecology

## 2012-01-28 ENCOUNTER — Encounter (HOSPITAL_COMMUNITY)
Admission: RE | Disposition: A | Discharge status: Home / Self Care | Payer: Self-pay | Source: Ambulatory Visit | Attending: Obstetrics and Gynecology

## 2012-01-28 LAB — CBC
HCT: 30.8 % — ABNORMAL LOW (ref 36.0–46.0)
Hemoglobin: 9.9 g/dL — ABNORMAL LOW (ref 12.0–15.0)
MCH: 25.4 pg — ABNORMAL LOW (ref 26.0–34.0)
RBC: 3.89 MIL/uL (ref 3.87–5.11)

## 2012-01-28 LAB — RPR: RPR Ser Ql: NONREACTIVE

## 2012-01-28 SURGERY — Surgical Case
Anesthesia: Regional | Site: Abdomen | Wound class: Clean Contaminated

## 2012-01-28 MED ORDER — SODIUM CHLORIDE 0.9 % IR SOLN
Status: DC | PRN
  Administered 2012-01-28: 1000 mL

## 2012-01-28 MED ORDER — DIPHENHYDRAMINE HCL 25 MG PO CAPS: 25.0000 mg | ORAL_CAPSULE | ORAL | Status: DC | PRN

## 2012-01-28 MED ORDER — DIPHENHYDRAMINE HCL 50 MG/ML IJ SOLN: 25.0000 mg | INTRAMUSCULAR | Status: DC | PRN

## 2012-01-28 MED ORDER — FLEET ENEMA 7-19 GM/118ML RE ENEM: 1.0000 | ENEMA | Freq: Every day | RECTAL | Status: DC | PRN

## 2012-01-28 MED ORDER — PHENYLEPHRINE 40 MCG/ML (10ML) SYRINGE FOR IV PUSH (FOR BLOOD PRESSURE SUPPORT)
PREFILLED_SYRINGE | INTRAVENOUS | Status: AC
  Filled 2012-01-28: qty 5

## 2012-01-28 MED ORDER — LACTATED RINGERS IV SOLN
500.0000 mL | INTRAVENOUS | Status: DC | PRN
  Administered 2012-01-28: 500 mL via INTRAVENOUS

## 2012-01-28 MED ORDER — EPHEDRINE 5 MG/ML INJ
10.0000 mg | INTRAVENOUS | Status: DC | PRN
  Filled 2012-01-28: qty 4

## 2012-01-28 MED ORDER — SIMETHICONE 80 MG PO CHEW
80.0000 mg | CHEWABLE_TABLET | ORAL | Status: DC | PRN
  Administered 2012-01-31: 80 mg via ORAL

## 2012-01-28 MED ORDER — SODIUM BICARBONATE 8.4 % IV SOLN
INTRAVENOUS | Status: AC
  Filled 2012-01-28: qty 50

## 2012-01-28 MED ORDER — SODIUM CHLORIDE 0.9 % IJ SOLN: 3.0000 mL | Freq: Two times a day (BID) | INTRAMUSCULAR | Status: DC

## 2012-01-28 MED ORDER — LANOLIN HYDROUS EX OINT: 1.0000 "application " | TOPICAL_OINTMENT | CUTANEOUS | Status: DC | PRN

## 2012-01-28 MED ORDER — MORPHINE SULFATE (PF) 0.5 MG/ML IJ SOLN
INTRAMUSCULAR | Status: DC | PRN
  Administered 2012-01-28: 1 mg via INTRAVENOUS

## 2012-01-28 MED ORDER — CITRIC ACID-SODIUM CITRATE 334-500 MG/5ML PO SOLN
30.0000 mL | ORAL | Status: DC | PRN
  Administered 2012-01-28: 30 mL via ORAL
  Filled 2012-01-28 (×2): qty 15

## 2012-01-28 MED ORDER — OXYTOCIN 20 UNITS IN LACTATED RINGERS INFUSION - SIMPLE
125.0000 mL/h | INTRAVENOUS | Status: AC
  Administered 2012-01-28: 125 mL/h via INTRAVENOUS

## 2012-01-28 MED ORDER — FENTANYL CITRATE 0.05 MG/ML IJ SOLN
25.0000 ug | INTRAMUSCULAR | Status: DC | PRN
  Administered 2012-01-28: 50 ug via INTRAVENOUS

## 2012-01-28 MED ORDER — EPHEDRINE 5 MG/ML INJ: 10.0000 mg | INTRAVENOUS | Status: DC | PRN

## 2012-01-28 MED ORDER — FENTANYL CITRATE 0.05 MG/ML IJ SOLN
INTRAMUSCULAR | Status: AC
  Filled 2012-01-28: qty 2

## 2012-01-28 MED ORDER — SODIUM CHLORIDE 0.9 % IJ SOLN: 3.0000 mL | INTRAMUSCULAR | Status: DC | PRN

## 2012-01-28 MED ORDER — MENTHOL 3 MG MT LOZG: 1.0000 | LOZENGE | OROMUCOSAL | Status: DC | PRN

## 2012-01-28 MED ORDER — SCOPOLAMINE 1 MG/3DAYS TD PT72
MEDICATED_PATCH | TRANSDERMAL | Status: AC
  Filled 2012-01-28: qty 1

## 2012-01-28 MED ORDER — ACETAMINOPHEN 325 MG PO TABS: 325.0000 mg | ORAL_TABLET | ORAL | Status: DC | PRN

## 2012-01-28 MED ORDER — MORPHINE SULFATE (PF) 0.5 MG/ML IJ SOLN
INTRAMUSCULAR | Status: DC | PRN
  Administered 2012-01-28: 4 mg via EPIDURAL

## 2012-01-28 MED ORDER — SCOPOLAMINE 1 MG/3DAYS TD PT72
1.0000 | MEDICATED_PATCH | Freq: Once | TRANSDERMAL | Status: DC
  Administered 2012-01-28: 1.5 mg via TRANSDERMAL

## 2012-01-28 MED ORDER — FENTANYL 2.5 MCG/ML BUPIVACAINE 1/10 % EPIDURAL INFUSION (WH - ANES)
14.0000 mL/h | INTRAMUSCULAR | Status: DC
  Administered 2012-01-28 (×2): 14 mL/h via EPIDURAL
  Filled 2012-01-28 (×3): qty 60

## 2012-01-28 MED ORDER — CEFAZOLIN SODIUM 1-5 GM-% IV SOLN
INTRAVENOUS | Status: AC
  Filled 2012-01-28: qty 100

## 2012-01-28 MED ORDER — OXYTOCIN 20 UNITS IN LACTATED RINGERS INFUSION - SIMPLE
INTRAVENOUS | Status: AC
  Filled 2012-01-28: qty 1000

## 2012-01-28 MED ORDER — WITCH HAZEL-GLYCERIN EX PADS: 1.0000 "application " | MEDICATED_PAD | CUTANEOUS | Status: DC | PRN

## 2012-01-28 MED ORDER — LACTATED RINGERS IV SOLN
INTRAVENOUS | Status: DC | PRN
  Administered 2012-01-28 (×2): via INTRAVENOUS

## 2012-01-28 MED ORDER — ONDANSETRON HCL 4 MG/2ML IJ SOLN
INTRAMUSCULAR | Status: DC | PRN
  Administered 2012-01-28: 4 mg via INTRAVENOUS

## 2012-01-28 MED ORDER — KETOROLAC TROMETHAMINE 30 MG/ML IJ SOLN
INTRAMUSCULAR | Status: AC
  Filled 2012-01-28: qty 1

## 2012-01-28 MED ORDER — DIPHENHYDRAMINE HCL 50 MG/ML IJ SOLN: 12.5000 mg | INTRAMUSCULAR | Status: DC | PRN

## 2012-01-28 MED ORDER — LIDOCAINE-EPINEPHRINE (PF) 2 %-1:200000 IJ SOLN
INTRAMUSCULAR | Status: AC
  Filled 2012-01-28: qty 20

## 2012-01-28 MED ORDER — KETOROLAC TROMETHAMINE 30 MG/ML IJ SOLN
30.0000 mg | Freq: Four times a day (QID) | INTRAMUSCULAR | Status: AC | PRN
  Administered 2012-01-28: 30 mg via INTRAVENOUS

## 2012-01-28 MED ORDER — SIMETHICONE 80 MG PO CHEW
80.0000 mg | CHEWABLE_TABLET | Freq: Three times a day (TID) | ORAL | Status: DC
  Administered 2012-01-29 – 2012-01-31 (×9): 80 mg via ORAL

## 2012-01-28 MED ORDER — SODIUM BICARBONATE 8.4 % IV SOLN
INTRAVENOUS | Status: DC | PRN
  Administered 2012-01-28: 4 mL via EPIDURAL

## 2012-01-28 MED ORDER — ONDANSETRON HCL 4 MG PO TABS: 4.0000 mg | ORAL_TABLET | ORAL | Status: DC | PRN

## 2012-01-28 MED ORDER — ACETAMINOPHEN 325 MG PO TABS: 650.0000 mg | ORAL_TABLET | ORAL | Status: DC | PRN

## 2012-01-28 MED ORDER — SENNOSIDES-DOCUSATE SODIUM 8.6-50 MG PO TABS
2.0000 | ORAL_TABLET | Freq: Every day | ORAL | Status: DC
  Administered 2012-01-29 – 2012-01-30 (×2): 2 via ORAL

## 2012-01-28 MED ORDER — OXYCODONE-ACETAMINOPHEN 5-325 MG PO TABS: 1.0000 | ORAL_TABLET | ORAL | Status: DC | PRN

## 2012-01-28 MED ORDER — ACETAMINOPHEN 10 MG/ML IV SOLN
1000.0000 mg | Freq: Four times a day (QID) | INTRAVENOUS | Status: AC | PRN
  Filled 2012-01-28: qty 100

## 2012-01-28 MED ORDER — NALBUPHINE SYRINGE 5 MG/0.5 ML
5.0000 mg | INJECTION | INTRAMUSCULAR | Status: DC | PRN
  Filled 2012-01-28: qty 1

## 2012-01-28 MED ORDER — ONDANSETRON HCL 4 MG/2ML IJ SOLN: 4.0000 mg | Freq: Three times a day (TID) | INTRAMUSCULAR | Status: DC | PRN

## 2012-01-28 MED ORDER — MEPERIDINE HCL 25 MG/ML IJ SOLN: 6.2500 mg | INTRAMUSCULAR | Status: DC | PRN

## 2012-01-28 MED ORDER — ONDANSETRON HCL 4 MG/2ML IJ SOLN
INTRAMUSCULAR | Status: AC
  Filled 2012-01-28: qty 2

## 2012-01-28 MED ORDER — OXYTOCIN 20 UNITS IN LACTATED RINGERS INFUSION - SIMPLE: 125.0000 mL/h | Freq: Once | INTRAVENOUS | Status: DC

## 2012-01-28 MED ORDER — IBUPROFEN 600 MG PO TABS: 600.0000 mg | ORAL_TABLET | Freq: Four times a day (QID) | ORAL | Status: DC | PRN

## 2012-01-28 MED ORDER — LACTATED RINGERS IV SOLN: INTRAVENOUS | Status: DC

## 2012-01-28 MED ORDER — KETOROLAC TROMETHAMINE 30 MG/ML IJ SOLN: 30.0000 mg | Freq: Four times a day (QID) | INTRAMUSCULAR | Status: AC | PRN

## 2012-01-28 MED ORDER — NALOXONE HCL 0.4 MG/ML IJ SOLN: 0.4000 mg | INTRAMUSCULAR | Status: DC | PRN

## 2012-01-28 MED ORDER — PRENATAL MULTIVITAMIN CH
1.0000 | ORAL_TABLET | Freq: Every day | ORAL | Status: DC
  Administered 2012-01-29 – 2012-01-31 (×3): 1 via ORAL
  Filled 2012-01-28 (×3): qty 1

## 2012-01-28 MED ORDER — LACTATED RINGERS IV SOLN
INTRAVENOUS | Status: DC
  Administered 2012-01-29 (×2): via INTRAVENOUS

## 2012-01-28 MED ORDER — ONDANSETRON HCL 4 MG/2ML IJ SOLN
4.0000 mg | Freq: Four times a day (QID) | INTRAMUSCULAR | Status: DC | PRN
  Administered 2012-01-28: 4 mg via INTRAVENOUS
  Filled 2012-01-28: qty 2

## 2012-01-28 MED ORDER — OXYTOCIN BOLUS FROM INFUSION
500.0000 mL | Freq: Once | INTRAVENOUS | Status: DC
  Filled 2012-01-28: qty 500

## 2012-01-28 MED ORDER — LACTATED RINGERS IV SOLN
INTRAVENOUS | Status: DC
  Administered 2012-01-28: 125 mL/h via INTRAVENOUS

## 2012-01-28 MED ORDER — MORPHINE SULFATE 0.5 MG/ML IJ SOLN
INTRAMUSCULAR | Status: AC
  Filled 2012-01-28: qty 10

## 2012-01-28 MED ORDER — FENTANYL 2.5 MCG/ML BUPIVACAINE 1/10 % EPIDURAL INFUSION (WH - ANES)
INTRAMUSCULAR | Status: DC | PRN
  Administered 2012-01-28: 13 mL/h via EPIDURAL

## 2012-01-28 MED ORDER — PHENYLEPHRINE 40 MCG/ML (10ML) SYRINGE FOR IV PUSH (FOR BLOOD PRESSURE SUPPORT)
80.0000 ug | PREFILLED_SYRINGE | INTRAVENOUS | Status: DC | PRN
  Filled 2012-01-28: qty 5

## 2012-01-28 MED ORDER — FLEET ENEMA 7-19 GM/118ML RE ENEM: 1.0000 | ENEMA | RECTAL | Status: DC | PRN

## 2012-01-28 MED ORDER — FENTANYL CITRATE 0.05 MG/ML IJ SOLN
INTRAMUSCULAR | Status: DC | PRN
  Administered 2012-01-28 (×2): 50 ug via INTRAVENOUS

## 2012-01-28 MED ORDER — DIPHENHYDRAMINE HCL 25 MG PO CAPS: 25.0000 mg | ORAL_CAPSULE | Freq: Four times a day (QID) | ORAL | Status: DC | PRN

## 2012-01-28 MED ORDER — PHENYLEPHRINE 40 MCG/ML (10ML) SYRINGE FOR IV PUSH (FOR BLOOD PRESSURE SUPPORT): 80.0000 ug | PREFILLED_SYRINGE | INTRAVENOUS | Status: DC | PRN

## 2012-01-28 MED ORDER — ZOLPIDEM TARTRATE 5 MG PO TABS: 5.0000 mg | ORAL_TABLET | Freq: Every evening | ORAL | Status: DC | PRN

## 2012-01-28 MED ORDER — IBUPROFEN 600 MG PO TABS
600.0000 mg | ORAL_TABLET | Freq: Four times a day (QID) | ORAL | Status: DC
  Administered 2012-01-29 – 2012-01-31 (×10): 600 mg via ORAL
  Filled 2012-01-28 (×10): qty 1

## 2012-01-28 MED ORDER — PROMETHAZINE HCL 25 MG/ML IJ SOLN: 6.2500 mg | INTRAMUSCULAR | Status: DC | PRN

## 2012-01-28 MED ORDER — LACTATED RINGERS IV SOLN: 500.0000 mL | Freq: Once | INTRAVENOUS | Status: DC

## 2012-01-28 MED ORDER — SODIUM CHLORIDE 0.9 % IV SOLN
1.0000 ug/kg/h | INTRAVENOUS | Status: DC | PRN
  Filled 2012-01-28: qty 2.5

## 2012-01-28 MED ORDER — DIBUCAINE 1 % RE OINT: 1.0000 "application " | TOPICAL_OINTMENT | RECTAL | Status: DC | PRN

## 2012-01-28 MED ORDER — BISACODYL 10 MG RE SUPP: 10.0000 mg | Freq: Every day | RECTAL | Status: DC | PRN

## 2012-01-28 MED ORDER — SODIUM CHLORIDE 0.9 % IV SOLN: 250.0000 mL | INTRAVENOUS | Status: DC

## 2012-01-28 MED ORDER — PHENYLEPHRINE HCL 10 MG/ML IJ SOLN
INTRAMUSCULAR | Status: DC | PRN
  Administered 2012-01-28: 80 ug via INTRAVENOUS

## 2012-01-28 MED ORDER — OXYTOCIN 20 UNITS IN LACTATED RINGERS INFUSION - SIMPLE
1.0000 m[IU]/min | INTRAVENOUS | Status: DC
  Administered 2012-01-28: 2 m[IU]/min via INTRAVENOUS
  Filled 2012-01-28: qty 1000

## 2012-01-28 MED ORDER — OXYTOCIN 20 UNITS IN LACTATED RINGERS INFUSION - SIMPLE
INTRAVENOUS | Status: DC | PRN
  Administered 2012-01-28: 20 [IU] via INTRAVENOUS

## 2012-01-28 MED ORDER — LIDOCAINE HCL (PF) 1 % IJ SOLN
30.0000 mL | INTRAMUSCULAR | Status: DC | PRN
  Filled 2012-01-28: qty 30

## 2012-01-28 MED ORDER — ONDANSETRON HCL 4 MG/2ML IJ SOLN: 4.0000 mg | INTRAMUSCULAR | Status: DC | PRN

## 2012-01-28 MED ORDER — TERBUTALINE SULFATE 1 MG/ML IJ SOLN: 0.2500 mg | Freq: Once | INTRAMUSCULAR | Status: DC | PRN

## 2012-01-28 MED ORDER — TETANUS-DIPHTH-ACELL PERTUSSIS 5-2.5-18.5 LF-MCG/0.5 IM SUSP: 0.5000 mL | Freq: Once | INTRAMUSCULAR | Status: DC

## 2012-01-28 MED ORDER — OXYCODONE-ACETAMINOPHEN 5-325 MG PO TABS
1.0000 | ORAL_TABLET | ORAL | Status: DC | PRN
  Administered 2012-01-29 – 2012-01-30 (×6): 1 via ORAL
  Administered 2012-01-31: 2 via ORAL
  Filled 2012-01-28: qty 2
  Filled 2012-01-28 (×7): qty 1

## 2012-01-28 MED ORDER — METOCLOPRAMIDE HCL 5 MG/ML IJ SOLN: 10.0000 mg | Freq: Three times a day (TID) | INTRAMUSCULAR | Status: DC | PRN

## 2012-01-28 MED ORDER — CEFAZOLIN SODIUM 1-5 GM-% IV SOLN
INTRAVENOUS | Status: DC | PRN
  Administered 2012-01-28: 1 g via INTRAVENOUS

## 2012-01-28 SURGICAL SUPPLY — 30 items
CLOTH BEACON ORANGE TIMEOUT ST (SAFETY) ×2 IMPLANT
DERMABOND ADVANCED (GAUZE/BANDAGES/DRESSINGS) ×1
DERMABOND ADVANCED .7 DNX12 (GAUZE/BANDAGES/DRESSINGS) ×1 IMPLANT
DRESSING TELFA 8X3 (GAUZE/BANDAGES/DRESSINGS) ×2 IMPLANT
ELECT REM PT RETURN 9FT ADLT (ELECTROSURGICAL) ×2
ELECTRODE REM PT RTRN 9FT ADLT (ELECTROSURGICAL) ×1 IMPLANT
EXTRACTOR VACUUM M CUP 4 TUBE (SUCTIONS) IMPLANT
GAUZE SPONGE 4X4 12PLY STRL LF (GAUZE/BANDAGES/DRESSINGS) IMPLANT
GLOVE BIO SURGEON STRL SZ7 (GLOVE) ×4 IMPLANT
GOWN PREVENTION PLUS LG XLONG (DISPOSABLE) ×4 IMPLANT
KIT ABG SYR 3ML LUER SLIP (SYRINGE) ×2 IMPLANT
NEEDLE HYPO 25X5/8 SAFETYGLIDE (NEEDLE) ×2 IMPLANT
NS IRRIG 1000ML POUR BTL (IV SOLUTION) ×2 IMPLANT
PACK C SECTION WH (CUSTOM PROCEDURE TRAY) ×2 IMPLANT
PAD ABD 7.5X8 STRL (GAUZE/BANDAGES/DRESSINGS) ×2 IMPLANT
SLEEVE SCD COMPRESS KNEE MED (MISCELLANEOUS) ×2 IMPLANT
STAPLER VISISTAT 35W (STAPLE) ×2 IMPLANT
STRIP CLOSURE SKIN 1/4X4 (GAUZE/BANDAGES/DRESSINGS) IMPLANT
SUT CHROMIC 0 CT 1 (SUTURE) IMPLANT
SUT CHROMIC 0 CTX 36 (SUTURE) ×4 IMPLANT
SUT CHROMIC 2 0 SH (SUTURE) IMPLANT
SUT MNCRL AB 3-0 PS2 27 (SUTURE) ×2 IMPLANT
SUT PDS AB 0 CT 36 (SUTURE) ×2 IMPLANT
SUT PLAIN 0 NONE (SUTURE) IMPLANT
SUT PLAIN 2 0 XLH (SUTURE) IMPLANT
SUT VIC AB 3-0 CT1 27 (SUTURE) ×1
SUT VIC AB 3-0 CT1 TAPERPNT 27 (SUTURE) ×1 IMPLANT
TOWEL OR 17X24 6PK STRL BLUE (TOWEL DISPOSABLE) ×4 IMPLANT
TRAY FOLEY CATH 14FR (SET/KITS/TRAYS/PACK) IMPLANT
WATER STERILE IRR 1000ML POUR (IV SOLUTION) ×2 IMPLANT

## 2012-01-28 NOTE — Progress Notes (Signed)
Pt transferred from room 166 to room 171

## 2012-01-28 NOTE — Progress Notes (Signed)
Patient ID: Rebecca Christensen, female   DOB: 09-10-1991, 21 y.o.   MRN: [id number] 2 mu pitocin Cervix 9 cm vtx -1 fhr reactive no decels

## 2012-01-28 NOTE — Brief Op Note (Signed)
01/28/2012  8:10 PM  PATIENT:  Rebecca Christensen  21 y.o. female  PRE-OPERATIVE DIAGNOSIS:  failure to [rogress  POST-OPERATIVE DIAGNOSIS:  failure to progress  PROCEDURE:  Procedure(s) (LRB): CESAREAN SECTION (N/A)  SURGEON:  Surgeon(s) and Role:    * Juluis Mire, MD - Primary  PHYSICIAN ASSISTANT:   ASSISTANTS: none   ANESTHESIA:   epidural  EBL:  Total I/O In: 600 [I.V.:600] Out: 600 [Urine:100; Blood:500]  BLOOD ADMINISTERED:none  DRAINS: Urinary Catheter (Foley)   LOCAL MEDICATIONS USED:  NONE  SPECIMEN:  Source of Specimen:  placnta  DISPOSITION OF SPECIMEN:  PATHOLOGY  COUNTS:  YES  TOURNIQUET:  * No tourniquets in log *  DICTATION: .Other Dictation: Dictation Number [id number]  PLAN OF CARE: Admit to inpatient   PATIENT DISPOSITION:  PACU - hemodynamically stable.   Delay start of Pharmacological VTE agent (>24hrs) due to surgical blood loss or risk of bleeding: no

## 2012-01-28 NOTE — H&P (Signed)
Rebecca Christensen is a 21 y.o. female presenting at 42.5 with favorable cervix for arom.  Negative GBS. History OB History    Grav Para Term Preterm Abortions TAB SAB Ect Mult Living   1 0             Past Medical History  Diagnosis Date  . Kidney stones   . Kidney calculi   . Abnormal Pap smear   . History of chicken pox   . Dysrhythmia     hx palpitations had echo 2009 ?SVT  . Lactose intolerance     but does consume dairy   Past Surgical History  Procedure Date  . Renal artery stent 10/12    Removed after 1 month  . Wisdom tooth extraction   . Colposcopy 2009   Family History: family history includes Hyperlipidemia in her mother.  There is no history of Anesthesia problems, and Hypotension, and Malignant hyperthermia, and Pseudochol deficiency, . Social History:  reports that she has never smoked. She has never used smokeless tobacco. She reports that she does not drink alcohol or use illicit drugs.  ROS  Dilation: 4 (Simultaneous filing. User may not have seen previous data.) Effacement (%): 80 (Simultaneous filing. User may not have seen previous data.) Station: 0 (Simultaneous filing. User may not have seen previous data.) Exam by:: dr. Arelia Sneddon Blood pressure 111/76, pulse 121, temperature 97.9 F (36.6 C), temperature source Oral, resp. rate 20, height 5\' 1"  (1.549 m), weight 55.792 kg (123 lb). Maternal Exam:  Uterine Assessment: Contraction strength is mild.  Contraction frequency is irregular.   Abdomen: Patient reports no abdominal tenderness. Fundal height is c/w ates.   Estimated fetal weight is 7 lbs.   Fetal presentation: vertex  Introitus: Amniotic fluid character: clear.  Pelvis: adequate for delivery.   Cervix: Cervix evaluated by digital exam.     Physical Exam  Prenatal labs: ABO, Rh: B/Positive/-- (08/02 0000) Antibody: Negative (08/02 0000) Rubella: Immune (08/02 0000) RPR: Nonreactive (08/02 0000)  HBsAg: Negative (08/02 0000)  HIV:  Non-reactive (08/02 0000)  GBS: Negative (01/21 0000)   Assessment/Plan: IUP at 39 + for AROM. r isk of pitocin disscused  Caydn Justen S 01/28/2012, 7:45 AM

## 2012-01-28 NOTE — Addendum Note (Signed)
Addendum  created 01/28/12 2304 by Ihsan Nomura S Tim Wilhide, CRNA   Modules edited:Charges VN    

## 2012-01-28 NOTE — Progress Notes (Signed)
Patient ID: Rebecca Christensen, female   DOB: 1991-01-21, 21 y.o.   MRN: [id number] Patient name DICTATION#  [id number] CSN# [id number]  Juluis Mire, MD 01/28/2012 8:11 PM

## 2012-01-28 NOTE — Progress Notes (Signed)
Patient ID: Rebecca Christensen, female   DOB: 10/08/1991, 21 y.o.   MRN: [id number] Cervix at 9 cm for 3.5 hours despite adequate ctx's by iupc cx with increased swelling Failure to progress procede with cesarean sectionRisk of cesarean section discussed.  These include:  Risk of infection;  Risk of hemorrhage that could require transfusions with the associated risk of aids or hepatitis;  Excessive bleeding could require hysterectomy;  Risk of injury to adjacent organs including bladder, bowel or ureters;  Risk of DVT's and possible pulmonary embolus.  Patient expresses a understanding of indications and risks.;

## 2012-01-28 NOTE — Transfer of Care (Signed)
Immediate Anesthesia Transfer of Care Note  Patient: Rebecca Christensen  Procedure(s) Performed: Procedure(s) (LRB): CESAREAN SECTION (N/A)  Patient Location: PACU  Anesthesia Type: Epidural  Level of Consciousness: awake, alert , oriented and patient cooperative  Airway & Oxygen Therapy: Patient Spontanous Breathing  Post-op Assessment: Report given to PACU RN and Post -op Vital signs reviewed and stable  Post vital signs: Reviewed and stable  Complications: No apparent anesthesia complications

## 2012-01-28 NOTE — Addendum Note (Signed)
Addendum  created 01/28/12 2304 by Rosalia Hammers, CRNA   Modules edited:Charges VN

## 2012-01-28 NOTE — Anesthesia Preprocedure Evaluation (Signed)
Anesthesia Evaluation  Patient identified by MRN, date of birth, ID band Patient awake    Reviewed: Allergy & Precautions, H&P , Patient's Chart, lab work & pertinent test results  Airway Mallampati: II TM Distance: >3 FB Neck ROM: full    Dental  (+) Teeth Intact   Pulmonary  clear to auscultation        Cardiovascular - dysrhythmias (? SVT, No Meds) regular Normal    Neuro/Psych    GI/Hepatic   Endo/Other    Renal/GU      Musculoskeletal   Abdominal   Peds  Hematology   Anesthesia Other Findings       Reproductive/Obstetrics (+) Pregnancy                           Anesthesia Physical Anesthesia Plan  ASA: II  Anesthesia Plan: Epidural   Post-op Pain Management:    Induction:   Airway Management Planned:   Additional Equipment:   Intra-op Plan:   Post-operative Plan:   Informed Consent: I have reviewed the patients History and Physical, chart, labs and discussed the procedure including the risks, benefits and alternatives for the proposed anesthesia with the patient or authorized representative who has indicated his/her understanding and acceptance.   Dental Advisory Given  Plan Discussed with:   Anesthesia Plan Comments: (Labs checked- platelets confirmed with RN in room. Fetal heart tracing, per RN, reported to be stable enough for sitting procedure. Discussed epidural, and patient consents to the procedure:  included risk of possible headache,backache, failed block, allergic reaction, and nerve injury. This patient was asked if she had any questions or concerns before the procedure started. )        Anesthesia Quick Evaluation

## 2012-01-28 NOTE — Anesthesia Procedure Notes (Signed)

## 2012-01-28 NOTE — Anesthesia Postprocedure Evaluation (Signed)
Anesthesia Post Note  Patient: Rebecca Christensen  Procedure(s) Performed: Procedure(s) (LRB): CESAREAN SECTION (N/A)  Anesthesia type: Epidural  Patient location: PACU  Post pain: Pain level controlled  Post assessment: Post-op Vital signs reviewed  Last Vitals:  Filed Vitals:   01/28/12 2015  BP: 94/55  Pulse: 121  Temp:   Resp: 32    Post vital signs: Reviewed  Level of consciousness: awake  Complications: No apparent anesthesia complications

## 2012-01-28 NOTE — Plan of Care (Signed)
Problem: Consults Goal: Birthing Suites Patient Information Press F2 to bring up selections list Outcome: Completed/Met Date Met:  01/28/12  Pt 37-[redacted] weeks EGA and Inpatient induction

## 2012-01-28 NOTE — OR Nursing (Signed)
Uterus massaged by S. Taleshia Luff RN. Two tubes of cord blood to lab. Foley catheter in place upon arrival to OR. Urine color -concentrated. 

## 2012-01-29 ENCOUNTER — Encounter (HOSPITAL_COMMUNITY): Payer: Self-pay | Admitting: Obstetrics and Gynecology

## 2012-01-29 LAB — CBC
HCT: 23.4 % — ABNORMAL LOW (ref 36.0–46.0)
Hemoglobin: 7.6 g/dL — ABNORMAL LOW (ref 12.0–15.0)
MCV: 79.1 fL (ref 78.0–100.0)
RBC: 2.96 MIL/uL — ABNORMAL LOW (ref 3.87–5.11)
WBC: 18.9 10*3/uL — ABNORMAL HIGH (ref 4.0–10.5)

## 2012-01-29 MED ORDER — FERROUS SULFATE 325 (65 FE) MG PO TABS
325.0000 mg | ORAL_TABLET | Freq: Three times a day (TID) | ORAL | Status: DC
  Administered 2012-01-29 – 2012-01-31 (×7): 325 mg via ORAL
  Filled 2012-01-29 (×7): qty 1

## 2012-01-29 NOTE — Addendum Note (Signed)
Addendum  created 01/29/12 0932 by Edison Pace, CRNA   Modules edited:Charges VN, Notes Section

## 2012-01-29 NOTE — Progress Notes (Signed)
UR chart review completed.  

## 2012-01-29 NOTE — Progress Notes (Signed)
Subjective: Postpartum Day 1: Cesarean Delivery Patient reports tolerating PO.    Objective: Vital signs in last 24 hours: Temp:  [97.7 F (36.5 C)-102 F (38.9 C)] 98.8 F (37.1 C) (02/20 0730) Pulse Rate:  [75-161] 91  (02/20 0730) Resp:  [18-32] 18  (02/20 0730) BP: (86-123)/(37-89) 88/43 mmHg (02/20 0730) SpO2:  [97 %-100 %] 97 % (02/20 0730)  Physical Exam:  General: alert and cooperative Lochia: appropriate Uterine Fundus: firm Incision: healing well DVT Evaluation: No evidence of DVT seen on physical exam. Foley with adequate urine op  Basename 01/29/12 0525 01/28/12 0650  HGB 7.6* 9.9*  HCT 23.4* 30.8*    Assessment/Plan: Status post Cesarean section. Doing well postoperatively.  Continue current care.  Nevayah Faust G 01/29/2012, 8:17 AM

## 2012-01-29 NOTE — Anesthesia Postprocedure Evaluation (Signed)
  Anesthesia Post-op Note  Patient: Rebecca Christensen  Procedure(s) Performed: Procedure(s) (LRB): CESAREAN SECTION (N/A)  Patient Location: Mother/Baby  Anesthesia Type: Epidural  Level of Consciousness: awake, alert  and oriented  Airway and Oxygen Therapy: Patient Spontanous Breathing  Post-op Pain: none  Post-op Assessment: Post-op Vital signs reviewed  Post-op Vital Signs: stable  Complications: No apparent anesthesia complications

## 2012-01-29 NOTE — Op Note (Signed)
NAMEALLISSON, SCHINDEL NO.:  [id number]  MEDICAL RECORD NO.:  [id number]  LOCATION:  9130                          FACILITY:  [facility]  PHYSICIAN:  Juluis Mire, M.D.   DATE OF BIRTH:  29-Oct-1991  DATE OF PROCEDURE:  01/28/2012 DATE OF DISCHARGE:                              OPERATIVE REPORT   PREOPERATIVE DIAGNOSIS:  Intrauterine pregnancy at term with failure to progress.  POSTOPERATIVE DIAGNOSIS:  Intrauterine pregnancy at term with failure to progress.  OPERATIVE PROCEDURE:  Low transverse cesarean section.  SURGEON:  Juluis Mire, M.D.  ANESTHESIA:  Epidural.  ESTIMATED BLOOD LOSS:  500-600 mL.  PACKS:  None.  DRAINS:  Included an urethral Foley.  INTRAOPERATIVE BLOOD PLACED:  None.  COMPLICATIONS:  None.  INDICATIONS:  The patient is a 21 year old primigravida female at 47 plus weeks with favorable cervix, presents for induction of labor, progressed to 9 cm of dilatation.  Despite Pitocin, adequate contractions by intrauterine pressure catheter, she failed to progress over 3-4 hours and remained arrest at 9 cm with increasing swelling of the cervix.  Decision was to proceed with primary cesarean section.  The risks were discussed including the risk of infection.  Risk of hemorrhage that could require transfusion with the risks of AIDS or hepatitis.  Excessive bleeding could require hysterectomy.  Risk of injury to adjacent organs including bladder, bowel, ureters that could require further exploratory surgery.  Risk of deep venous thrombosis and pulmonary embolus.  The patient expressed understanding of indications, risks, and potential complications.  DESCRIPTION OF PROCEDURE:  The patient was taken to the OR, placed in supine position with left lateral tilt.  After satisfactory level of epidural anesthesia was obtained, the abdomen was prepped out with Betadine and draped as a sterile field.  Low transverse skin incision was made with  knife, carried through the subcutaneous tissue.  Fascia was entered sharply and incision was fashioned laterally.  Fascia was taken off the muscle superiorly and inferiorly.  Rectus muscles were separated in the midline.  Anterior peritoneum was entered sharply. Incision of peritoneum was extended both superiorly and inferiorly.  Low transverse bladder flap was developed.  A low transverse uterine incision begun with knife and extended laterally using manual traction. Amniotic fluid was meconium stained.  The infant was delivered with the elevation of head and fundal pressure.  Infant was a viable female, Apgars were 9/9, weight was 6 pounds and 11 ounces.  Placenta was delivered manually.  Uterus was exteriorized for closure.  Uterus was closed in a locking suture of 0 chromic using a 2-layer closure technique.  We had good hemostasis and clear urine output.  Tubes and ovaries were unremarkable.  Uterus returned to abdominal cavity.  We irrigated the pelvis.  We had good hemostasis.  Again, urine output was clear. Muscles and peritoneum closed with a running suture of 3-0 Vicryl. Fascia was closed with a running suture of 0 PDS.  Skin was closed in running subcuticular of 3-0 Monocryl and Dermabond.  Sponge, instrument, and needle count was reported as correct by circulating nurse x2.  Foley catheter with clear urine at the time of closure.  The patient  tolerated the procedure well, returned to recovery in good condition.     Juluis Mire, M.D.     JSM/MEDQ  D:  01/28/2012  T:  01/29/2012  Job:  [id number]

## 2012-01-30 NOTE — Progress Notes (Signed)
Subjective: Postpartum Day 2: Cesarean Delivery Patient reports tolerating PO, + flatus and no problems voiding.    Objective: Vital signs in last 24 hours: Temp:  [98 F (36.7 C)-98.9 F (37.2 C)] 98 F (36.7 C) (02/21 0645) Pulse Rate:  [76-98] 90  (02/21 0645) Resp:  [16-18] 18  (02/21 0645) BP: (81-97)/(41-64) 97/64 mmHg (02/21 0645) SpO2:  [97 %-98 %] 98 % (02/20 1952)  Physical Exam:  General: alert and cooperative Lochia: appropriate Uterine Fundus: firm Incision: healing well DVT Evaluation: No evidence of DVT seen on physical exam.   Basename 01/29/12 0525 01/28/12 0650  HGB 7.6* 9.9*  HCT 23.4* 30.8*    Assessment/Plan: Status post Cesarean section. Doing well postoperatively.  Continue current care.  Jalyn Rosero G 01/30/2012, 8:20 AM

## 2012-01-31 MED ORDER — AMOXICILLIN-POT CLAVULANATE 875-125 MG PO TABS
1.0000 | ORAL_TABLET | Freq: Two times a day (BID) | ORAL | Status: DC
  Administered 2012-01-31: 1 via ORAL
  Filled 2012-01-31 (×3): qty 1

## 2012-01-31 MED ORDER — IBUPROFEN 600 MG PO TABS: 600.0000 mg | ORAL_TABLET | Freq: Four times a day (QID) | ORAL | Status: AC

## 2012-01-31 MED ORDER — OXYCODONE-ACETAMINOPHEN 5-325 MG PO TABS: 1.0000 | ORAL_TABLET | ORAL | Status: AC | PRN

## 2012-01-31 MED ORDER — FERROUS SULFATE 325 (65 FE) MG PO TABS: 325.0000 mg | ORAL_TABLET | Freq: Three times a day (TID) | ORAL | Status: DC

## 2012-01-31 MED ORDER — AMOXICILLIN-POT CLAVULANATE 875-125 MG PO TABS: 1.0000 | ORAL_TABLET | Freq: Two times a day (BID) | ORAL | Status: AC

## 2012-01-31 NOTE — Discharge Summary (Signed)
Obstetric Discharge Summary Reason for Admission: induction of labor Prenatal Procedures: ultrasound Intrapartum Procedures: cesarean: low cervical, transverse Postpartum Procedures: none Complications-Operative and Postpartum: erythema inferior to incision and mons and labial edema Hemoglobin  Date Value Range Status  01/29/2012 7.6* 12.0-15.0 (g/dL) Final     DELTA CHECK NOTED     REPEATED TO VERIFY     HCT  Date Value Range Status  01/29/2012 23.4* 36.0-46.0 (%) Final    Discharge Diagnoses: Term Pregnancy-delivered  Discharge Information: Date: 01/31/2012 Activity: pelvic rest Diet: routine Medications: PNV, Ibuprofen, Percocet and augmentin Condition: stable Instructions: refer to practice specific booklet Discharge to: home   Newborn Data: Live born female  Birth Weight: 6 lb 11.6 oz (3050 g) APGAR: 9, 9  Home with mother.  Tiffny Gemmer G 01/31/2012, 8:26 AM

## 2012-01-31 NOTE — Progress Notes (Signed)
Subjective: Postpartum Day 3: Cesarean Delivery Patient reports tolerating PO, + flatus and no problems voiding.  Erythema noted inferior to incision, Mons and R labial edema noted, soft ,no ecchymosis noted  Objective: Vital signs in last 24 hours: Temp:  [97.8 F (36.6 C)-98.4 F (36.9 C)] 98.4 F (36.9 C) (02/22 0550) Pulse Rate:  [82-87] 84  (02/22 0550) Resp:  [18] 18  (02/22 0550) BP: (98-110)/(63-87) 98/63 mmHg (02/22 0550) SpO2:  [100 %] 100 % (02/21 2235)  Physical Exam:  General: alert and cooperative Lochia: appropriate Uterine Fundus: firm Incision: Inc CDI, erythema and edema noted inferior to incision extending into R labia and clitoral hood DVT Evaluation: No evidence of DVT seen on physical exam.   Basename 01/29/12 0525  HGB 7.6*  HCT 23.4*    Assessment/Plan: Status post Cesarean section. Postoperative course complicated by ? early cellulitis  Discharge home with standard precautions and return to clinic in 3 days  Augmentin 875 mg BID and ice pack.  Carnel Stegman G 01/31/2012, 8:11 AM

## 2013-03-07 IMAGING — US US RENAL
1 series · 14 of 25 positions shown · non-contrast
Comparison: None.

CLINICAL DATA: Right flank pain.  Urolithiasis.  20 weeks
pregnant.

RENAL/URINARY TRACT ULTRASOUND COMPLETE

[Series 1: us renal · 14 of 37 slices shown]
[im 1/37]
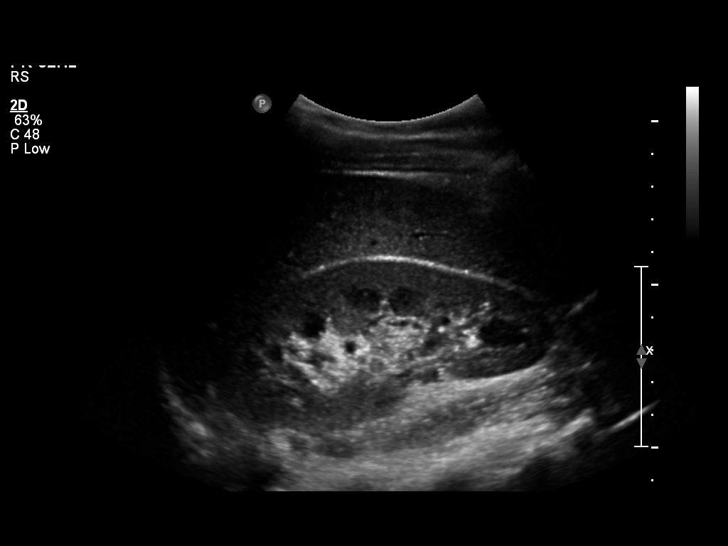
[im 4/37]
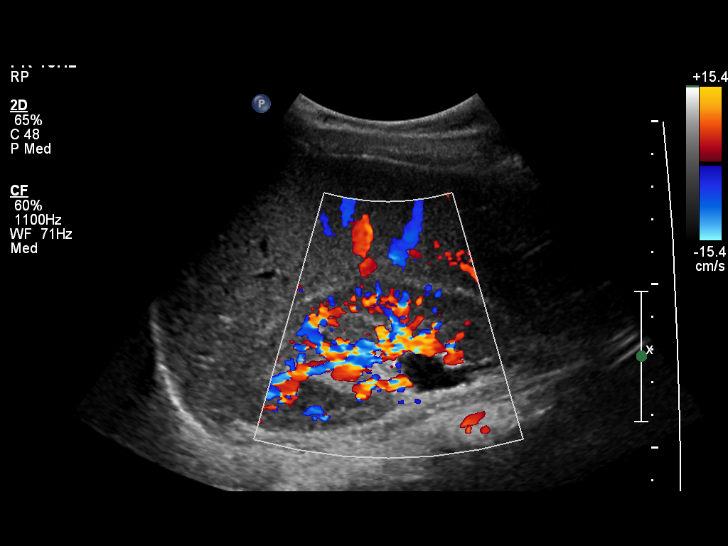
[im 7/37]
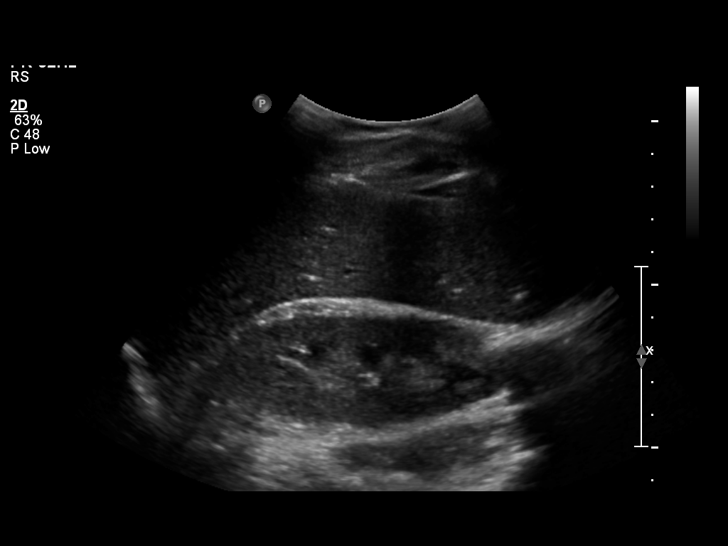
[im 10/37]
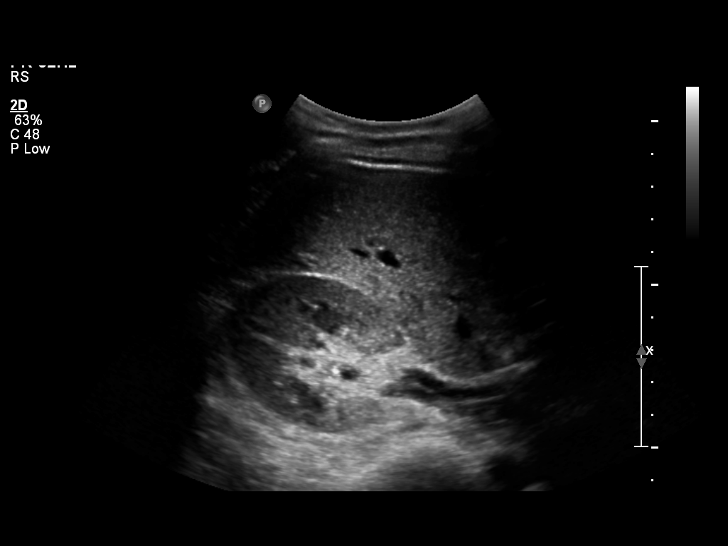
[im 13/37]
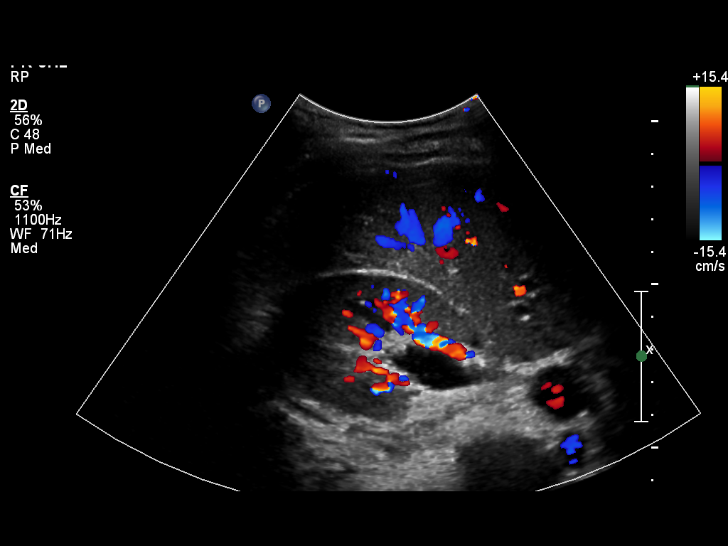
[im 14/37]
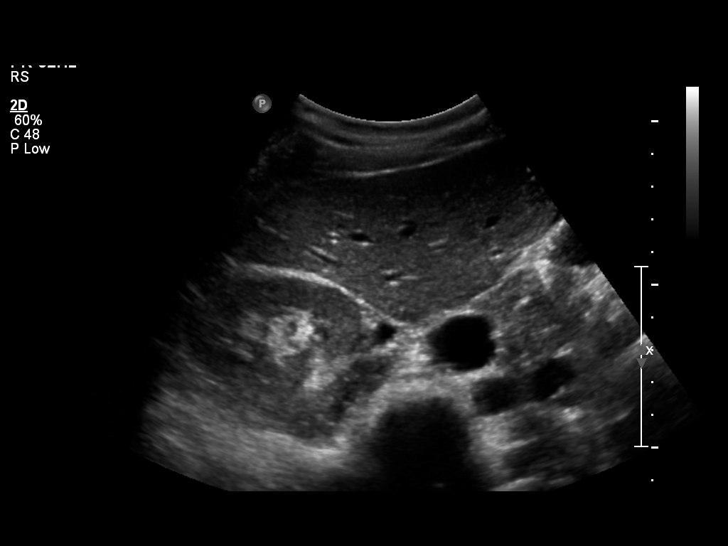
[im 17/37]
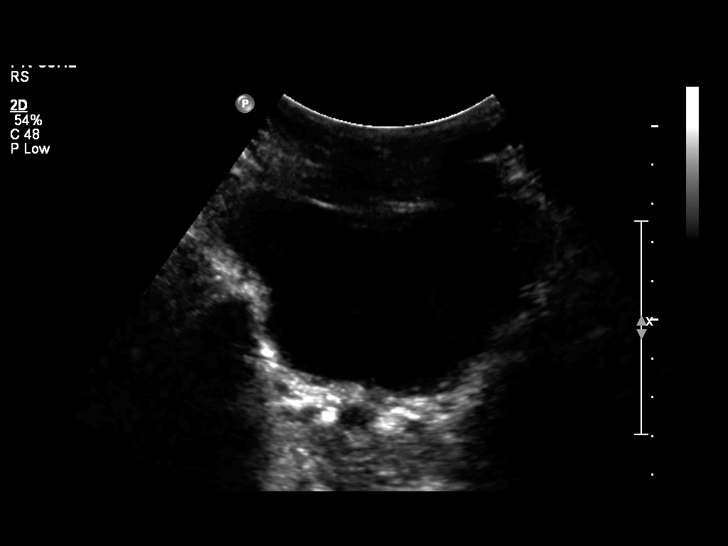
[im 20/37]
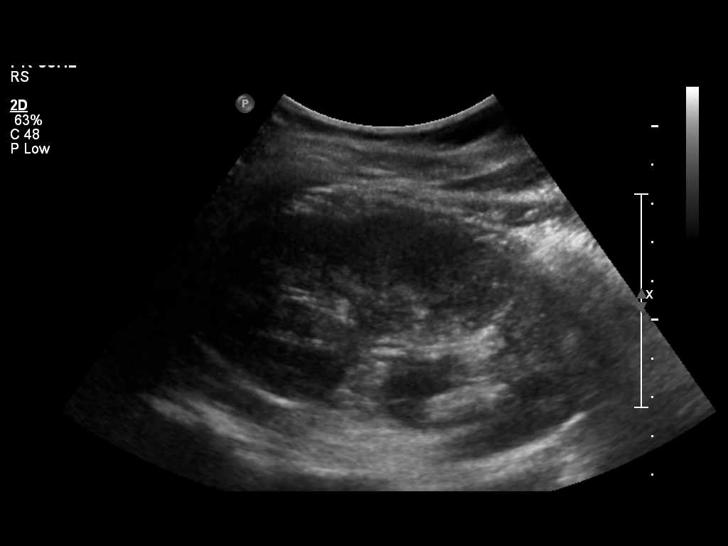
[im 23/37]
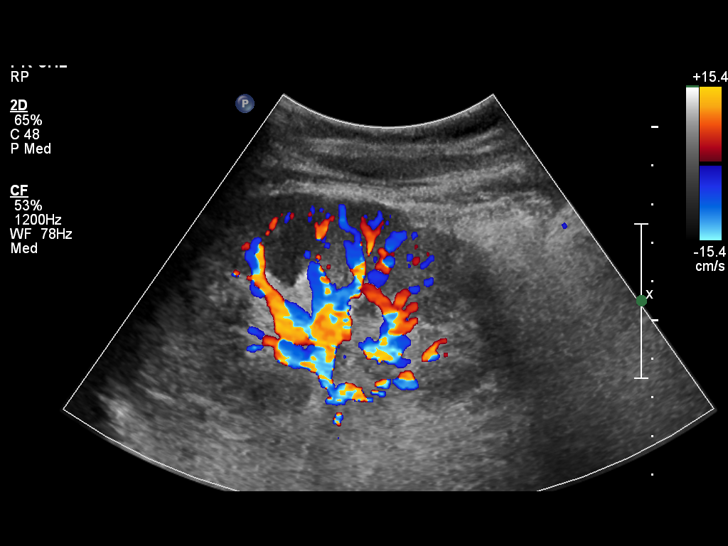
[im 25/37]
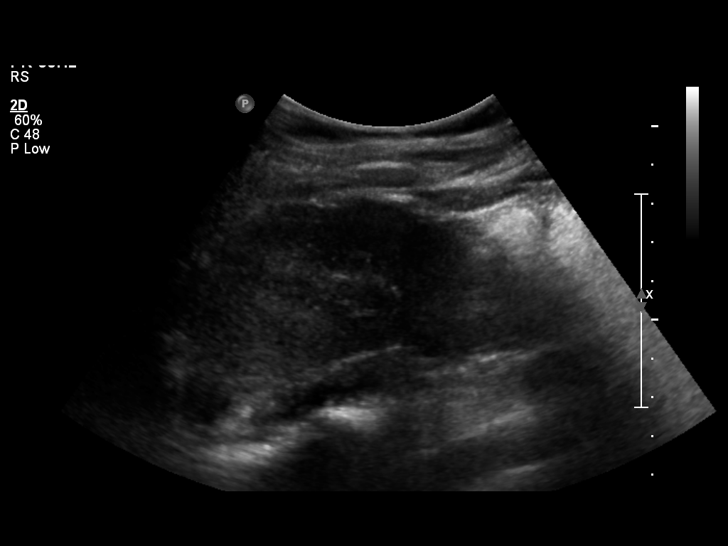
[im 28/37]
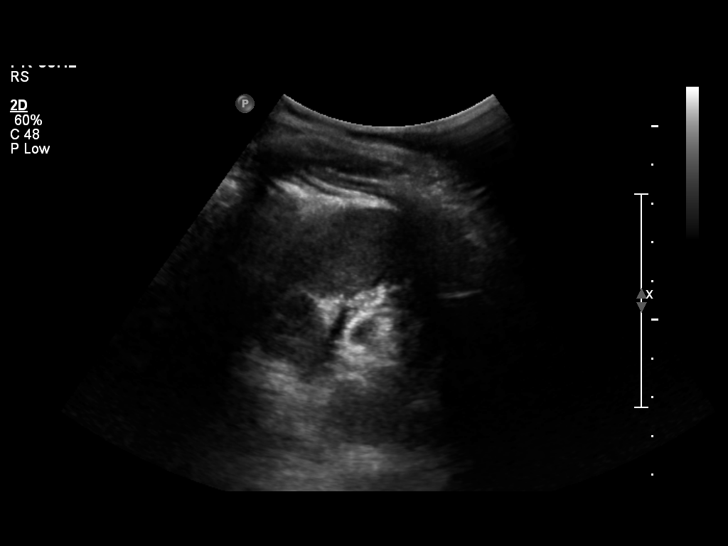
[im 31/37]
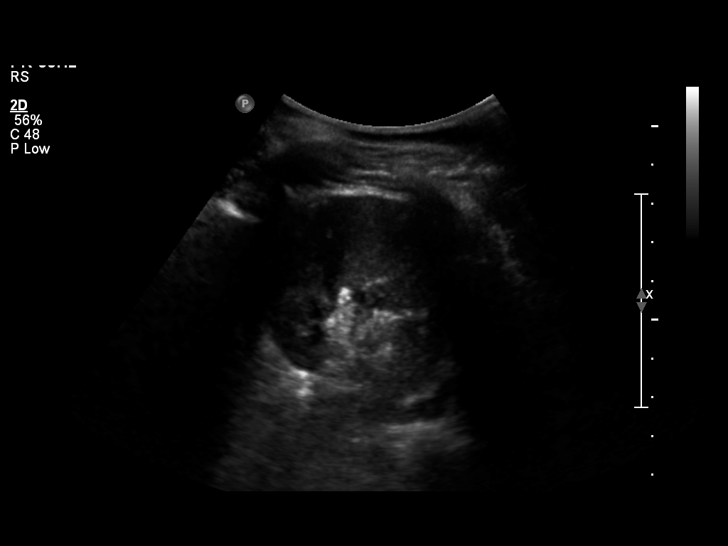
[im 34/37]
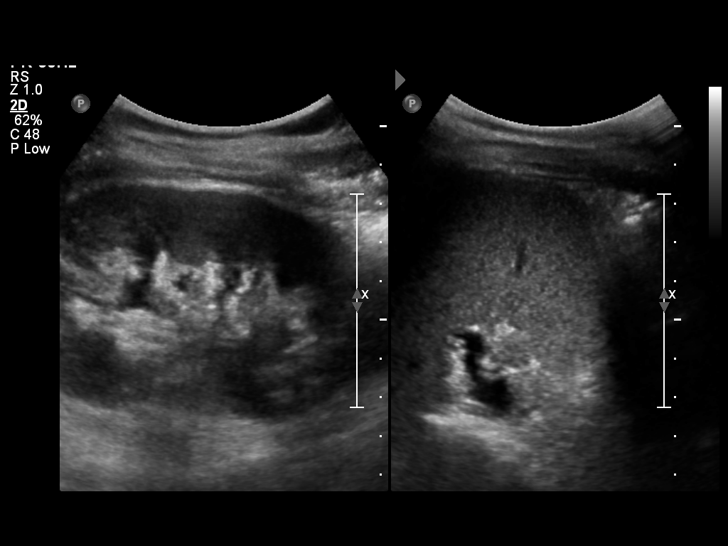
[im 37/37]
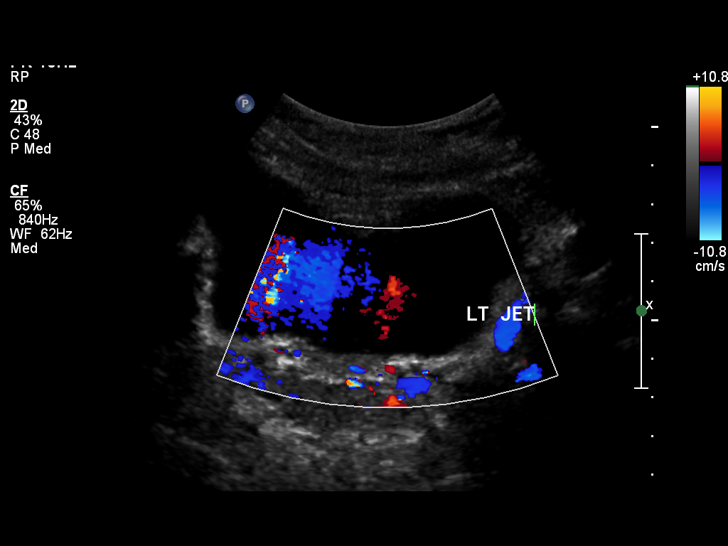

[14 of 25 positions shown; findings below may reference images not displayed]

FINDINGS: Right Kidney:  Normal in size and parenchymal echogenicity.  No
evidence of mass or hydronephrosis.

Left Kidney:  Normal in size and parenchymal echogenicity.  No
evidence of mass or hydronephrosis.

Bladder:  Appears normal for degree of bladder distention.
IMPRESSION: Normal study.  No evidence of hydronephrosis or other significant
abnormality.

## 2014-10-10 ENCOUNTER — Encounter (HOSPITAL_COMMUNITY): Payer: Self-pay | Admitting: Obstetrics and Gynecology

## 2015-03-21 ENCOUNTER — Other Ambulatory Visit: Payer: Self-pay | Admitting: Urology

## 2015-03-21 DIAGNOSIS — Z30431 Encounter for routine checking of intrauterine contraceptive device: Secondary | ICD-10-CM

## 2015-03-27 ENCOUNTER — Ambulatory Visit
Admission: RE | Admit: 2015-03-27 | Discharge: 2015-03-27 | Disposition: A | Discharge status: Home / Self Care | Payer: Medicaid Other | Source: Ambulatory Visit | Attending: Urology | Admitting: Urology

## 2015-03-27 DIAGNOSIS — Z30431 Encounter for routine checking of intrauterine contraceptive device: Secondary | ICD-10-CM

## 2015-04-08 ENCOUNTER — Encounter (HOSPITAL_COMMUNITY): Payer: Self-pay | Admitting: Emergency Medicine

## 2015-04-08 ENCOUNTER — Emergency Department (HOSPITAL_COMMUNITY)
Admission: EM | Admit: 2015-04-08 | Discharge: 2015-04-09 | Disposition: A | Discharge status: Home / Self Care | Payer: Medicaid Other | Attending: Emergency Medicine | Admitting: Emergency Medicine

## 2015-04-08 DIAGNOSIS — Z87442 Personal history of urinary calculi: Secondary | ICD-10-CM | POA: Insufficient documentation

## 2015-04-08 DIAGNOSIS — Z792 Long term (current) use of antibiotics: Secondary | ICD-10-CM | POA: Insufficient documentation

## 2015-04-08 DIAGNOSIS — N72 Inflammatory disease of cervix uteri: Secondary | ICD-10-CM | POA: Insufficient documentation

## 2015-04-08 DIAGNOSIS — Z8639 Personal history of other endocrine, nutritional and metabolic disease: Secondary | ICD-10-CM | POA: Insufficient documentation

## 2015-04-08 DIAGNOSIS — R102 Pelvic and perineal pain: Secondary | ICD-10-CM

## 2015-04-08 DIAGNOSIS — Z79899 Other long term (current) drug therapy: Secondary | ICD-10-CM | POA: Insufficient documentation

## 2015-04-08 DIAGNOSIS — Z8619 Personal history of other infectious and parasitic diseases: Secondary | ICD-10-CM | POA: Insufficient documentation

## 2015-04-09 ENCOUNTER — Emergency Department (HOSPITAL_COMMUNITY): Payer: Medicaid Other

## 2015-04-09 LAB — WET PREP, GENITAL
Trich, Wet Prep: NONE SEEN
Yeast Wet Prep HPF POC: NONE SEEN

## 2015-04-09 MED ORDER — HYDROCODONE-ACETAMINOPHEN 5-325 MG PO TABS: 2.0000 | ORAL_TABLET | ORAL | Status: DC | PRN

## 2015-04-09 MED ORDER — AZITHROMYCIN 250 MG PO TABS
1000.0000 mg | ORAL_TABLET | Freq: Once | ORAL | Status: AC
  Administered 2015-04-09: 1000 mg via ORAL
  Filled 2015-04-09: qty 4

## 2015-04-09 MED ORDER — CEFTRIAXONE SODIUM 250 MG IJ SOLR
250.0000 mg | Freq: Once | INTRAMUSCULAR | Status: AC
  Administered 2015-04-09: 250 mg via INTRAMUSCULAR
  Filled 2015-04-09: qty 250

## 2015-04-09 MED ORDER — LIDOCAINE HCL (PF) 1 % IJ SOLN
INTRAMUSCULAR | Status: AC
  Administered 2015-04-09: 2 mL
  Filled 2015-04-09: qty 5

## 2015-04-09 MED ORDER — DOXYCYCLINE HYCLATE 100 MG PO CAPS: 100.0000 mg | ORAL_CAPSULE | Freq: Two times a day (BID) | ORAL | Status: DC

## 2015-04-09 NOTE — ED Notes (Signed)
Pt states that she is having pain in the inner vagina area.  Pt states that she went to the OB/GYN who stated he could not see her IUD.  Pt was asked if she had pain at that time and she denied pain.  Today around 8PM pt states she began having a sharp pain in her cervex area and assumed that her IUD had moved.  Pt describes pain as sharp.

## 2015-04-09 NOTE — ED Provider Notes (Signed)
CSN: [id number]     Arrival date & time 04/08/15  2337 History   First MD Initiated Contact with Patient 04/09/15 0135     Chief Complaint  Patient presents with  . Vaginal Pain     (Consider location/radiation/quality/duration/timing/severity/associated sxs/prior Treatment) HPI Comments: Patient states that she noticed the string for IUD was missing in March.  She's been seen by her OB/GYN who did an ultrasound and stated that the IUD was not in the lower right, place, but she was unable to give more information than that.  Today she states that she had severe sudden stabbing pain in her vaginal area.  Denies any dysuria, nausea, vomiting, diarrhea, vaginal discharge, vaginal bleeding, denies any vaginal trauma  Patient is a 24 y.o. female presenting with vaginal pain. The history is provided by the patient.  Vaginal Pain This is a new problem. The current episode started today. The problem occurs constantly. The problem has been unchanged. Associated symptoms include abdominal pain. Pertinent negatives include no fever, nausea or vomiting. Nothing aggravates the symptoms. She has tried nothing for the symptoms.    Past Medical History  Diagnosis Date  . Kidney stones   . Kidney calculi   . Abnormal Pap smear   . History of chicken pox   . Dysrhythmia     hx palpitations had echo 2009 ?SVT  . Lactose intolerance     but does consume dairy   Past Surgical History  Procedure Laterality Date  . Renal artery stent  10/12    Removed after 1 month  . Wisdom tooth extraction    . Colposcopy  2009  . Cesarean section  01/28/2012    Procedure: CESAREAN SECTION;  Surgeon: Juluis Mire, MD;  Location: [facility];  Service: Gynecology;  Laterality: N/A;  Primary cesarean section with delivery of baby  boy at 47. Apgars 9/9.   Family History  Problem Relation Age of Onset  . Hyperlipidemia Mother   . Anesthesia problems Neg Hx   . Hypotension Neg Hx   . Malignant hyperthermia Neg Hx   .  Pseudochol deficiency Neg Hx    History  Substance Use Topics  . Smoking status: Never Smoker   . Smokeless tobacco: Never Used  . Alcohol Use: No   OB History    Gravida Para Term Preterm AB TAB SAB Ectopic Multiple Living   0 0 0 0 0 0 1     Review of Systems  Constitutional: Negative for fever.  Respiratory: Negative for shortness of breath.   Gastrointestinal: Positive for abdominal pain. Negative for nausea and vomiting.  Genitourinary: Positive for vaginal pain. Negative for dysuria, vaginal bleeding and vaginal discharge.  All other systems reviewed and are negative.     Allergies  Lactose intolerance (gi)  Home Medications   Prior to Admission medications   Medication Sig Start Date End Date Taking? Authorizing Provider  ibuprofen (ADVIL,MOTRIN) 200 MG tablet Take 400 mg by mouth every 6 (six) hours as needed (for pain.).   Yes Historical Provider, MD  Multiple Vitamin (MULTIVITAMIN WITH MINERALS) TABS tablet Take 1 tablet by mouth daily.   Yes Historical Provider, MD  doxycycline (VIBRAMYCIN) 100 MG capsule Take 1 capsule (100 mg total) by mouth 2 (two) times daily. 04/09/15   Earley Favor, NP  ferrous sulfate 325 (65 FE) MG tablet Take 1 tablet (325 mg total) by mouth 3 (three) times daily with meals. 01/31/12 01/30/13  Julio Sicks, NP  HYDROcodone-acetaminophen (  NORCO/VICODIN) 5-325 MG per tablet Take 2 tablets by mouth every 4 (four) hours as needed. 04/09/15   Earley Favor, NP   BP 99/74 mmHg  Pulse 97  Temp(Src) 98.3 F (36.8 C) (Oral)  Resp 18  SpO2 100%  LMP 04/01/2015 (Approximate) Physical Exam  Constitutional: She appears well-developed and well-nourished.  HENT:  Head: Normocephalic.  Eyes: Pupils are equal, round, and reactive to light.  Neck: Normal range of motion.  Cardiovascular: Normal rate.   Pulmonary/Chest: Effort normal.  Genitourinary: Vaginal discharge found.  No IUD string identified   Musculoskeletal: Normal range of motion.   Neurological: She is alert.  Skin: Skin is warm.  Nursing note and vitals reviewed.   ED Course  Procedures (including critical care time) Labs Review Labs Reviewed  WET PREP, GENITAL - Abnormal; Notable for the following:    Clue Cells Wet Prep HPF POC FEW (*)    WBC, Wet Prep HPF POC MODERATE (*)    All other components within normal limits  GC/CHLAMYDIA PROBE AMP ([place])    Imaging Review US Transvaginal Non-ob  04/09/2015   CLINICAL DATA:  Acute onset of pelvic pain.  Initial encounter.  EXAM: TRANSABDOMINAL AND TRANSVAGINAL ULTRASOUND OF PELVIS  TECHNIQUE: Both transabdominal and transvaginal ultrasound examinations of the pelvis were performed. Transabdominal technique was performed for global imaging of the pelvis including uterus, ovaries, adnexal regions, and pelvic cul-de-sac. It was necessary to proceed with endovaginal exam following the transabdominal exam to visualize the uterus and ovaries in greater detail.  COMPARISON:  Pelvic ultrasound performed 03/27/2015  FINDINGS: Uterus  Measurements: 7.9 x 3.3 x 4.3 cm. No fibroids or other mass visualized. Trace fluid is noted at the cervical canal.  Endometrium  Thickness: 0.9 cm. No focal abnormality visualized. Intrauterine device difficult to fully characterize but appears to be in expected position.  Right ovary  Measurements: 3.8 x 2.2 x 3.2 cm. Normal appearance/no adnexal mass.  Left ovary  Measurements: 2.8 x 1.2 x 1.7 cm. Normal appearance/no adnexal mass.  Other findings  Trace free fluid is noted within the pelvic cul-de-sac.  IMPRESSION: 1. No acute abnormality seen to explain the patient's symptoms. 2. Trace fluid noted at the cervical canal. Uterus and ovaries otherwise unremarkable. Intrauterine device difficult to fully characterize but appears to be in expected position.   Electronically Signed   By: Roanna Raider M.D.   On: 04/09/2015 04:57   US Pelvis Complete  04/09/2015   CLINICAL DATA:  Acute onset of pelvic  pain.  Initial encounter.  EXAM: TRANSABDOMINAL AND TRANSVAGINAL ULTRASOUND OF PELVIS  TECHNIQUE: Both transabdominal and transvaginal ultrasound examinations of the pelvis were performed. Transabdominal technique was performed for global imaging of the pelvis including uterus, ovaries, adnexal regions, and pelvic cul-de-sac. It was necessary to proceed with endovaginal exam following the transabdominal exam to visualize the uterus and ovaries in greater detail.  COMPARISON:  Pelvic ultrasound performed 03/27/2015  FINDINGS: Uterus  Measurements: 7.9 x 3.3 x 4.3 cm. No fibroids or other mass visualized. Trace fluid is noted at the cervical canal.  Endometrium  Thickness: 0.9 cm. No focal abnormality visualized. Intrauterine device difficult to fully characterize but appears to be in expected position.  Right ovary  Measurements: 3.8 x 2.2 x 3.2 cm. Normal appearance/no adnexal mass.  Left ovary  Measurements: 2.8 x 1.2 x 1.7 cm. Normal appearance/no adnexal mass.  Other findings  Trace free fluid is noted within the pelvic cul-de-sac.  IMPRESSION: 1. No acute abnormality  seen to explain the patient's symptoms. 2. Trace fluid noted at the cervical canal. Uterus and ovaries otherwise unremarkable. Intrauterine device difficult to fully characterize but appears to be in expected position.   Electronically Signed   By: Roanna RaiderJeffery  Chang M.D.   On: 04/09/2015 04:57     EKG Interpretation None      MDM   Final diagnoses:  Cervicitis         Earley FavorGail Floyed Masoud, NP 04/09/15 [id number]  Mirian MoMatthew Gentry, MD 04/09/15 [phone]

## 2015-04-09 NOTE — Discharge Instructions (Signed)
Early ultrasound done tonight, your IUD is in the proper place, but the strings are not visible.  You do have vaginal discharge or been treated for pelvic infection with antibiotics in the emergency department and given a prescription to continue these antibiotics at home.  Please make an appointment with your OB/GYN for follow-up

## 2015-04-10 LAB — GC/CHLAMYDIA PROBE AMP (~~LOC~~) NOT AT ARMC
CHLAMYDIA, DNA PROBE: NEGATIVE
NEISSERIA GONORRHEA: NEGATIVE

## 2016-09-02 ENCOUNTER — Encounter (HOSPITAL_COMMUNITY): Payer: Self-pay | Admitting: Emergency Medicine

## 2016-09-02 ENCOUNTER — Emergency Department (HOSPITAL_COMMUNITY)
Admission: EM | Admit: 2016-09-02 | Discharge: 2016-09-02 | Disposition: A | Discharge status: Home / Self Care | Payer: Medicaid Other | Attending: Emergency Medicine | Admitting: Emergency Medicine

## 2016-09-02 DIAGNOSIS — I471 Supraventricular tachycardia: Secondary | ICD-10-CM | POA: Diagnosis not present

## 2016-09-02 DIAGNOSIS — R000 Tachycardia, unspecified: Secondary | ICD-10-CM | POA: Diagnosis present

## 2016-09-02 LAB — CBC
HCT: 44.2 % (ref 36.0–46.0)
HEMOGLOBIN: 15 g/dL (ref 12.0–15.0)
MCH: 29.5 pg (ref 26.0–34.0)
MCHC: 33.9 g/dL (ref 30.0–36.0)
MCV: 86.8 fL (ref 78.0–100.0)
Platelets: 357 10*3/uL (ref 150–400)
RBC: 5.09 MIL/uL (ref 3.87–5.11)
RDW: 12.6 % (ref 11.5–15.5)
WBC: 12.3 10*3/uL — ABNORMAL HIGH (ref 4.0–10.5)

## 2016-09-02 LAB — COMPREHENSIVE METABOLIC PANEL
ALK PHOS: 29 U/L — AB (ref 38–126)
ALT: 12 U/L — AB (ref 14–54)
AST: 18 U/L (ref 15–41)
Albumin: 4.3 g/dL (ref 3.5–5.0)
Anion gap: 11 (ref 5–15)
BUN: 12 mg/dL (ref 6–20)
CALCIUM: 9.3 mg/dL (ref 8.9–10.3)
CO2: 21 mmol/L — AB (ref 22–32)
Chloride: 108 mmol/L (ref 101–111)
Creatinine, Ser: 0.87 mg/dL (ref 0.44–1.00)
Glucose, Bld: 87 mg/dL (ref 65–99)
Potassium: 3.4 mmol/L — ABNORMAL LOW (ref 3.5–5.1)
SODIUM: 140 mmol/L (ref 135–145)
Total Bilirubin: 0.6 mg/dL (ref 0.3–1.2)
Total Protein: 7.4 g/dL (ref 6.5–8.1)

## 2016-09-02 LAB — I-STAT TROPONIN, ED: Troponin i, poc: 0.07 ng/mL (ref 0.00–0.08)

## 2016-09-02 MED ORDER — SODIUM CHLORIDE 0.9 % IV SOLN
Freq: Once | INTRAVENOUS | Status: AC
  Administered 2016-09-02: 11:00:00 via INTRAVENOUS

## 2016-09-02 MED ORDER — ETOMIDATE 2 MG/ML IV SOLN
15.0000 mg | Freq: Once | INTRAVENOUS | Status: DC
  Filled 2016-09-02: qty 10

## 2016-09-02 NOTE — ED Triage Notes (Signed)
Pt here for tachycardia and palpitations; sts hx of same

## 2016-09-02 NOTE — ED Notes (Signed)
Patient placed on zoll pads 

## 2016-09-02 NOTE — ED Provider Notes (Signed)
In  [facility]-EMER[facility]ENCY DEPT Provider Note   CSN: [id number] Arrival date & time: 09/02/16  0941     History   Chief Complaint Chief Complaint  Patient presents with  . Tachycardia    HPI Rebecca Christensen is a 25 y.o. female.   Palpitations   This is a recurrent problem. The current episode started less than 1 hour ago. The problem occurs constantly. The problem has not changed since onset.The problem is associated with an unknown factor. On average, each episode lasts 1 hour. Associated symptoms include chest pain. Pertinent negatives include no diaphoresis, no fever, no irregular heartbeat, no syncope, no nausea, no vomiting, no dizziness, no weakness, no cough and no sputum production. She has tried nothing for the symptoms. There are no known risk factors.    Past Medical History:  Diagnosis Date  . Abnormal Pap smear   . Dysrhythmia    hx palpitations had echo 2009 ?SVT  . History of chicken pox   . Kidney calculi   . Kidney stones   . Lactose intolerance    but does consume dairy    There are no active problems to display for this patient.   Past Surgical History:  Procedure Laterality Date  . CESAREAN SECTION  01/28/2012   Procedure: CESAREAN SECTION;  Surgeon: Juluis Mire, MD;  Location: [facility];  Service: Gynecology;  Laterality: N/A;  Primary cesarean section with delivery of baby  boy at 13. Apgars 9/9.  Marland Kitchen COLPOSCOPY  2009  . RENAL ARTERY STENT  10/12   Removed after 1 month  . WISDOM TOOTH EXTRACTION      OB History    Gravida Para Term Preterm AB Living   1 1 1  0 0 1   SAB TAB Ectopic Multiple Live Births   0 0 0 0 1       Home Medications    Prior to Admission medications   Medication Sig Start Date End Date Taking? Authorizing Provider  doxycycline (VIBRAMYCIN) 100 MG capsule Take 1 capsule (100 mg total) by mouth 2 (two) times daily. Patient not taking: Reported on 09/02/2016 04/09/15   Earley Favor, NP  ferrous sulfate 325 (65 FE) MG tablet Take 1  tablet (325 mg total) by mouth 3 (three) times daily with meals. 01/31/12 01/30/13  Julio Sicks, NP  HYDROcodone-acetaminophen (NORCO/VICODIN) 5-325 MG per tablet Take 2 tablets by mouth every 4 (four) hours as needed. Patient not taking: Reported on 09/02/2016 04/09/15   Earley Favor, NP  ibuprofen (ADVIL,MOTRIN) 200 MG tablet Take 400 mg by mouth every 6 (six) hours as needed (for pain.).    Historical Provider, MD  Multiple Vitamin (MULTIVITAMIN WITH MINERALS) TABS tablet Take 1 tablet by mouth daily.    Historical Provider, MD    Family History Family History  Problem Relation Age of Onset  . Hyperlipidemia Mother   . Anesthesia problems Neg Hx   . Hypotension Neg Hx   . Malignant hyperthermia Neg Hx   . Pseudochol deficiency Neg Hx     Social History Social History  Substance Use Topics  . Smoking status: Never Smoker  . Smokeless tobacco: Never Used  . Alcohol use No     Allergies   Lactose intolerance (gi)   Review of Systems Review of Systems  Constitutional: Negative for diaphoresis and fever.  HENT: Negative for congestion and rhinorrhea.   Eyes: Negative for pain and discharge.  Respiratory: Negative for cough and sputum production.   Cardiovascular: Positive for  chest pain and palpitations. Negative for syncope.  Gastrointestinal: Negative for nausea and vomiting.  Genitourinary: Negative for difficulty urinating and dysuria.  Musculoskeletal: Negative for neck pain and neck stiffness.  Neurological: Negative for dizziness and weakness.  Psychiatric/Behavioral: Negative for agitation and confusion.     Physical Exam Updated Vital Signs BP 100/56   Pulse 96   Temp 98.3 F (36.8 C) (Oral)   Resp 18   SpO2 100%   Physical Exam  Constitutional: She is oriented to person, place, and time. She appears well-developed and well-nourished. No distress.  HENT:  Head: Normocephalic and atraumatic.  Eyes: Conjunctivae and EOM are normal. Pupils are equal, round,  and reactive to light.  Neck: Neck supple.  Cardiovascular: Regular rhythm.   No murmur heard. Tachycardic to 200's  Pulmonary/Chest: Effort normal and breath sounds normal. No respiratory distress.  Abdominal: Soft. There is no tenderness.  Musculoskeletal: She exhibits no edema or deformity.  Neurological: She is alert and oriented to person, place, and time.  Skin: Skin is warm and dry.  Psychiatric: She has a normal mood and affect.  Nursing note and vitals reviewed.    ED Treatments / Results  Labs (all labs ordered are listed, but only abnormal results are displayed) Labs Reviewed  CBC  COMPREHENSIVE METABOLIC PANEL  I-STAT TROPOININ, ED    EKG  EKG Interpretation  Date/Time:  Monday September 02 2016 10:27:26 EDT Ventricular Rate:  94 PR Interval:    QRS Duration: 134 QT Interval:  347 QTC Calculation: 434 R Axis:   81 Text Interpretation:  Sinus rhythm IVCD, consider atypical RBBB improved SVT Confirmed by [facility] MD, Barbara Cower [phone]) on 09/02/2016 12:11:47 PM       Radiology No results found.  Procedures Procedures (including critical care time)  Medications Ordered in ED Medications  etomidate (AMIDATE) injection 15 mg (0 mg Intravenous Hold 09/02/16 1040)  0.9 %  sodium chloride infusion ( Intravenous New Bag/Given 09/02/16 1059)     Initial Impression / Assessment and Plan / ED Course  I have reviewed the triage vital signs and the nursing notes.  Pertinent labs & imaging results that were available during my care of the patient were reviewed by me and considered in my medical decision making (see chart for details).  Clinical Course    [name] is a 25 year old female with past medical history significant for SVT, nephrolithiasis, renal artery stent who presents the ED for palpitations.  She is found to have a heart rate greater than 200.  EKG was obtained and demonstrates SVT with a widened QRS complex.  Patient remained normotensive.  Attempted  vagal maneuvers without success.  Pads were placed and the patient was consented for cardioversion.  Etomidate was ordered, but the patient spontaneously converted to normal sinus rhythm prior to administration.  Patient was not cardioverted.  Repeat EKG was obtained and demonstrates sinus rhythm with intraventricular conduction delay.  Laboratory studies were ordered and were unremarkable.  Patient was observed and did not reenter SVT.  Follow-up arrangements were made for PCP and cardiology care.  The patient's mother arrived and described an incomplete workup performed when the patient was 74 which is suspicious for Wolff-Parkinson-White syndrome.  Patient was discharge to home with strict return precautions, follow up instructions, and educational materials.  Final Clinical Impressions(s) / ED Diagnoses   Final diagnoses:  SVT (supraventricular tachycardia) (HCC)    New Prescriptions New Prescriptions   No medications on file     Casimiro Needle  Bary RichardE Allana Shrestha, MD 09/02/16 2025    Marily MemosJason Mesner, MD 09/03/16 [phone]

## 2016-09-02 NOTE — Care Management Note (Signed)
Case Management Note  Patient Details  Name: Rebecca Christensen MRN: [id number] Date of Birth: 02/03/1991  Subjective/Objective:                  25 yo Pt here for tachycardia and palpitations.  From home.  Action/Plan: Follow for disposition needs.   Expected Discharge Date:  09/02/16               Expected Discharge Plan:  Home/Self Care  In-House Referral:  NA  Discharge planning Services  CM Consult, Follow-up appt scheduled  Post Acute Care Choice:  NA Choice offered to:  NA  DME Arranged:  N/A DME Agency:  NA  HH Arranged:  NA HH Agency:  NA  Status of Service:  Completed, signed off  If discussed at Long Length of Stay Meetings, dates discussed:    Additional Comments: Follow-up appointment made?____yes, 10/4 @ 2:00 with Dr Amou______  Does patient have transportation Issues? (When/Time?)____no______  Medication needs?_____no___  Equipment Needs?____no___________   Oxygen Needs?____no___________  Do lines/drains need to be removed? If yes, then d/c at appropriate time?________no__________  PT equipment ordered?____no__________  H.H needed? ______no________   H.H ordered?______no________ Oletta CohnWood, Valissa Lyvers, RN 09/02/2016, 2:14 PM

## 2016-09-02 NOTE — ED Notes (Signed)
MD at bedside. 

## 2016-09-02 NOTE — ED Notes (Signed)
Patient now high 90's on monitor, EKG captured, Dr Clayborne DanaMesner made aware

## 2016-09-02 NOTE — ED Notes (Signed)
Patient stated that her head hurts.

## 2016-09-02 NOTE — ED Notes (Signed)
Patient placed on nasal canula with CO2 monitoring.

## 2016-09-02 NOTE — ED Notes (Signed)
Patient instructed on multiple maneuvers to help vagal down by Dr. Clayborne DanaMesner. Vagal maneuvers unsuccessful at this time

## 2016-09-11 ENCOUNTER — Encounter: Payer: Self-pay | Admitting: Family Medicine

## 2016-09-11 ENCOUNTER — Ambulatory Visit: Payer: Medicaid Other | Attending: Family Medicine | Admitting: Family Medicine

## 2016-09-11 DIAGNOSIS — I471 Supraventricular tachycardia: Secondary | ICD-10-CM | POA: Insufficient documentation

## 2016-09-11 DIAGNOSIS — G43701 Chronic migraine without aura, not intractable, with status migrainosus: Secondary | ICD-10-CM | POA: Diagnosis not present

## 2016-09-11 DIAGNOSIS — G43909 Migraine, unspecified, not intractable, without status migrainosus: Secondary | ICD-10-CM | POA: Insufficient documentation

## 2016-09-11 DIAGNOSIS — E739 Lactose intolerance, unspecified: Secondary | ICD-10-CM | POA: Insufficient documentation

## 2016-09-11 DIAGNOSIS — Z87442 Personal history of urinary calculi: Secondary | ICD-10-CM | POA: Insufficient documentation

## 2016-09-11 MED ORDER — AMITRIPTYLINE HCL 10 MG PO TABS: 25.0000 mg | ORAL_TABLET | Freq: Every day | ORAL | 1 refills | Status: DC

## 2016-09-11 MED ORDER — BUTALBITAL-APAP-CAFFEINE 50-300-40 MG PO CAPS: 1.0000 | ORAL_CAPSULE | Freq: Four times a day (QID) | ORAL | 1 refills | Status: DC | PRN

## 2016-09-11 NOTE — Patient Instructions (Signed)

## 2016-09-11 NOTE — Progress Notes (Signed)
Daily headaches since the incidence

## 2016-09-12 NOTE — Progress Notes (Signed)
Subjective:  Patient ID: Rebecca Christensen, female    DOB: 01/04/1991  Age: 25 y.o. MRN: [id number]  CC: heart abnormality (heart rate- 215 ED [facility])   HPI Rebecca Christensen is a 25 year old female with a history of SVT, renal calculi who comes into the clinic for an ED follow-up visit after she had presented with palpitations and EKG revealed wide complex tachycardia with regular rhythm, so she was planned for sedation and cardioversion but she spontaneously reverted to sinus rhythm.  She denies any history of palpitations after the incident and does not have an upcoming appointment with cardiology. Of note she has had a history of such in the past.  Complains of migraine headaches ever since the palpitations one week ago and has never been on medications for migraines. They occur 3 times a week with associated nausea but no photophobia. Over-the-counter analgesics have not helped.   she has a headache at this time.  Past Medical History:  Diagnosis Date  . Abnormal Pap smear   . Dysrhythmia    hx palpitations had echo 2009 ?SVT  . History of chicken pox   . Kidney calculi   . Kidney stones   . Lactose intolerance    but does consume dairy    Past Surgical History:  Procedure Laterality Date  . CESAREAN SECTION  01/28/2012   Procedure: CESAREAN SECTION;  Surgeon: Juluis MireJohn S McComb, MD;  Location: [facility];  Service: Gynecology;  Laterality: N/A;  Primary cesarean section with delivery of baby  boy at [id number]. Apgars 9/9.  Marland Kitchen. COLPOSCOPY  2009  . RENAL ARTERY STENT  10/12   Removed after 1 month  . WISDOM TOOTH EXTRACTION      Allergies  Allergen Reactions  . Lactose Intolerance (Gi) Other (See Comments)    GI upset     Outpatient Medications Prior to Visit  Medication Sig Dispense Refill  . ibuprofen (ADVIL,MOTRIN) 200 MG tablet Take 400 mg by mouth every 6 (six) hours as needed (for pain.).    Marland Kitchen. doxycycline (VIBRAMYCIN) 100 MG capsule Take 1 capsule (100 mg total) by mouth 2  (two) times daily. (Patient not taking: Reported on 09/02/2016) 20 capsule 0  . ferrous sulfate 325 (65 FE) MG tablet Take 1 tablet (325 mg total) by mouth 3 (three) times daily with meals. 30 tablet 1  . HYDROcodone-acetaminophen (NORCO/VICODIN) 5-325 MG per tablet Take 2 tablets by mouth every 4 (four) hours as needed. (Patient not taking: Reported on 09/02/2016) 10 tablet 0  . Multiple Vitamin (MULTIVITAMIN WITH MINERALS) TABS tablet Take 1 tablet by mouth daily.     No facility-administered medications prior to visit.     ROS Review of Systems  Constitutional: Negative for activity change, appetite change and fatigue.  HENT: Negative for congestion, sinus pressure and sore throat.   Eyes: Negative for visual disturbance.  Respiratory: Negative for cough, chest tightness, shortness of breath and wheezing.   Cardiovascular: Negative for chest pain and palpitations.  Gastrointestinal: Negative for abdominal distention, abdominal pain and constipation.  Endocrine: Negative for polydipsia.  Genitourinary: Negative for dysuria and frequency.  Musculoskeletal: Negative for arthralgias and back pain.  Skin: Negative for rash.  Neurological: Positive for headaches. Negative for tremors, light-headedness and numbness.  Hematological: Does not bruise/bleed easily.  Psychiatric/Behavioral: Negative for agitation and behavioral problems.    Objective:  BP 111/67 (BP Location: Right Arm, Patient Position: Sitting, Cuff Size: Small)   Pulse 96   Temp 98.7 F (37.1 C) (  Oral)   Ht 5\' 1"  (1.549 m)   Wt 114 lb 6.4 oz (51.9 kg)   SpO2 100%   BMI 21.62 kg/m   BP/Weight 09/11/2016 09/02/2016 04/09/2015  Systolic BP 111 95 121  Diastolic BP 67 66 63  Wt. (Lbs) 114.4 115 -  BMI 21.62 22.27 -      Physical Exam  Constitutional: She is oriented to person, place, and time. She appears well-developed and well-nourished.  Cardiovascular: Normal rate, normal heart sounds and intact distal pulses.     No murmur heard. Pulmonary/Chest: Effort normal and breath sounds normal. She has no wheezes. She has no rales. She exhibits no tenderness.  Abdominal: Soft. Bowel sounds are normal. She exhibits no distension and no mass. There is no tenderness.  Musculoskeletal: Normal range of motion.  Neurological: She is alert and oriented to person, place, and time.     Assessment & Plan:   1. SVT (supraventricular tachycardia) (HCC) Vitals are stable - Ambulatory referral to Cardiology  2. Chronic migraine without aura with status migrainosus. Discussed risks and benefits of preventative migraine medications. Topamax contraindicated due to her weight. Placed on Elavil-side effects discussed. We'll reassess for improvement at next office visit. - Butalbital-APAP-Caffeine 50-300-40 MG CAPS; Take 1 capsule by mouth every 6 (six) hours as needed.  Dispense: 60 capsule; Refill: 1 - amitriptyline (ELAVIL) 10 MG tablet; Take 2.5 tablets (25 mg total) by mouth at bedtime.  Dispense: 30 tablet; Refill: 1   Meds ordered this encounter  Medications  . Butalbital-APAP-Caffeine 50-300-40 MG CAPS    Sig: Take 1 capsule by mouth every 6 (six) hours as needed.    Dispense:  60 capsule    Refill:  1  . amitriptyline (ELAVIL) 10 MG tablet    Sig: Take 2.5 tablets (25 mg total) by mouth at bedtime.    Dispense:  30 tablet    Refill:  1    Follow-up: Return in about 1 month (around 10/12/2016) for Follow-up on migraines.   Jaclyn Shaggy MD

## 2016-09-16 ENCOUNTER — Ambulatory Visit (INDEPENDENT_AMBULATORY_CARE_PROVIDER_SITE_OTHER): Payer: Medicaid Other | Admitting: Internal Medicine

## 2016-09-16 ENCOUNTER — Encounter: Payer: Self-pay | Admitting: Internal Medicine

## 2016-09-16 VITALS — BP 108/81 | HR 87 | Ht 60.25 in | Wt 114.0 lb

## 2016-09-16 DIAGNOSIS — I471 Supraventricular tachycardia, unspecified: Secondary | ICD-10-CM

## 2016-09-16 NOTE — Patient Instructions (Addendum)
Medication Instructions:  Your physician recommends that you continue on your current medications as directed. Please refer to the Current Medication list given to you today.  Labwork: BMET/CBC 1-2 weeks prior to procedure   Testing/Procedures: Your physician has recommended that you have an ablation. Catheter ablation is a medical procedure used to treat some cardiac arrhythmias (irregular heartbeats). During catheter ablation, a long, thin, flexible tube is put into a blood vessel in your groin (upper thigh), or neck. This tube is called an ablation catheter. It is then guided to your heart through the blood vessel. Radio frequency waves destroy small areas of heart tissue where abnormal heartbeats may cause an arrhythmia to start. Please see the instruction sheet given to you today.  Possible dates: 10/20, 10/26, 11/1, 11/6, 11/16, 11/22, 12/1, 12/5, 12/8, 12/11, 12/20  Call our office to schedule - [phone]  Follow-Up: Follow-up to be determined  Any Other Special Instructions Will Be Listed Below (If Applicable).     If you need a refill on your cardiac medications before your next appointment, please call your pharmacy.

## 2016-09-16 NOTE — Progress Notes (Signed)
HPI Rebecca Christensen is referred today for evaluation of an almost wide QRS tachycardia. She had her first episode of heart racing at age 25. Since then they have occurred infrequently. However over the past few months she has had more. The last episode lasted about 3 hours. An ecg shows and almost wide QRS tachycardia at 206/min. The episode stopped suddenly just prior to her receiving IV adenosine. She has not been able to take beta blockers because of a relatively low blood pressure.  Allergies  Allergen Reactions  . Lactose Intolerance (Gi) Other (See Comments)    GI upset     Current Outpatient Prescriptions  Medication Sig Dispense Refill  . Pseudoephedrine-APAP-DM (DAYQUIL PO) Take 1 capsule by mouth daily as needed (Cold and/or sinus relief). DAYQUIL COLD & SINUS     No current facility-administered medications for this visit.      Past Medical History:  Diagnosis Date  . Abnormal Pap smear   . Dysrhythmia    hx palpitations had echo 2009 ?SVT  . History of chicken pox   . Kidney calculi   . Kidney stones   . Lactose intolerance    but does consume dairy    ROS:   All systems reviewed and negative except as noted in the HPI.   Past Surgical History:  Procedure Laterality Date  . CESAREAN SECTION  01/28/2012   Procedure: CESAREAN SECTION;  Surgeon: Juluis MireJohn S McComb, MD;  Location: [facility];  Service: Gynecology;  Laterality: N/A;  Primary cesarean section with delivery of baby  boy at [id number]. Apgars 9/9.  Marland Kitchen. COLPOSCOPY  2009  . RENAL ARTERY STENT  10/12   Removed after 1 month  . WISDOM TOOTH EXTRACTION       Family History  Problem Relation Age of Onset  . Hyperlipidemia Mother   . Anesthesia problems Neg Hx   . Hypotension Neg Hx   . Malignant hyperthermia Neg Hx   . Pseudochol deficiency Neg Hx      Social History   Social History  . Marital status: Single    Spouse name: N/A  . Number of children: N/A  . Years of education: N/A   Occupational  History  . Not on file.   Social History Main Topics  . Smoking status: Never Smoker  . Smokeless tobacco: Never Used  . Alcohol use 1.2 - 3.6 oz/week    1 - 3 Glasses of wine, 1 - 3 Cans of beer per week     Comment: socially  . Drug use: No  . Sexual activity: Yes   Other Topics Concern  . Not on file   Social History Narrative  . No narrative on file     BP 108/81   Pulse 87   Ht 5' 0.25" (1.53 m)   Wt 114 lb (51.7 kg)   BMI 22.08 kg/m   Physical Exam:  Well appearing 25 yo woman, NAD HEENT: Unremarkable Neck:  No JVD, no thyromegally Lymphatics:  No adenopathy Back:  No CVA tenderness Lungs:  Clear with no wheezes HEART:  Regular rate rhythm, no murmurs, no rubs, no clicks Abd:  soft, positive bowel sounds, no organomegally, no rebound, no guarding Ext:  2 plus pulses, no edema, no cyanosis, no clubbing Skin:  No rashes no nodules Neuro:  CN II through XII intact, motor grossly intact  EKG NSR with no pre-excitations  Assess/Plan: 1. 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 I have discussed the treatment options with the patient and her father. The risks/benefits/goals/expectations of catheter ablation were reviewed. She will call us if she wishes to proceed.  I spent 45 minutes with the patient and preparation of the note with over 50% face to face with the patient.  Leonia Reeves.D.

## 2016-09-26 ENCOUNTER — Telehealth: Payer: Self-pay | Admitting: Internal Medicine

## 2016-09-26 DIAGNOSIS — Z01812 Encounter for preprocedural laboratory examination: Secondary | ICD-10-CM

## 2016-09-26 NOTE — Telephone Encounter (Signed)
Follow Up:     Pt says she need to schedule an ablation please.

## 2016-09-27 NOTE — Telephone Encounter (Signed)
Called, spoke with pt. Informed appt date/time for SVT ablation and procedure information as follows:   Pt will have BMET/CBC/HCG labs [facility].   Pt will need a follow-up appointment in 4 weeks with Dr. Ladona Ridgelaylor. (Someone will call pt to schedule)  Procedure Information:   Please [facility] 11/08/16 at 5:30 AM  Nothing to eat or drink after midnight prior to procedure  Do not take any medication prior to procedure  Plan 1 night stay OR someone to drive you home  Pt verbalized understanding and agreed with plan.

## 2016-10-25 ENCOUNTER — Other Ambulatory Visit: Payer: Medicaid Other | Admitting: *Deleted

## 2016-10-25 DIAGNOSIS — Z01812 Encounter for preprocedural laboratory examination: Secondary | ICD-10-CM

## 2016-10-25 LAB — BASIC METABOLIC PANEL
BUN: 13 mg/dL (ref 7–25)
CHLORIDE: 105 mmol/L (ref 98–110)
CO2: 26 mmol/L (ref 20–31)
CREATININE: 0.89 mg/dL (ref 0.50–1.10)
Calcium: 9 mg/dL (ref 8.6–10.2)
GLUCOSE: 98 mg/dL (ref 65–99)
Potassium: 3.8 mmol/L (ref 3.5–5.3)
Sodium: 142 mmol/L (ref 135–146)

## 2016-10-25 LAB — CBC WITH DIFFERENTIAL/PLATELET
BASOS ABS: 82 {cells}/uL (ref 0–200)
Basophils Relative: 1 %
Eosinophils Absolute: 164 cells/uL (ref 15–500)
Eosinophils Relative: 2 %
HEMATOCRIT: 39.8 % (ref 35.0–45.0)
Hemoglobin: 13.6 g/dL (ref 11.7–15.5)
LYMPHS PCT: 28 %
Lymphs Abs: 2296 cells/uL (ref 850–3900)
MCH: 29.2 pg (ref 27.0–33.0)
MCHC: 34.2 g/dL (ref 32.0–36.0)
MCV: 85.4 fL (ref 80.0–100.0)
MONO ABS: 738 {cells}/uL (ref 200–950)
MPV: 8.8 fL (ref 7.5–12.5)
Monocytes Relative: 9 %
Neutro Abs: 4920 cells/uL (ref 1500–7800)
Neutrophils Relative %: 60 %
PLATELETS: 319 10*3/uL (ref 140–400)
RBC: 4.66 MIL/uL (ref 3.80–5.10)
RDW: 13.1 % (ref 11.0–15.0)
WBC: 8.2 10*3/uL (ref 3.8–10.8)

## 2016-10-26 LAB — HCG, SERUM, QUALITATIVE: Preg, Serum: NEGATIVE

## 2016-11-08 ENCOUNTER — Encounter (HOSPITAL_COMMUNITY): Payer: Self-pay | Admitting: Internal Medicine

## 2016-11-08 ENCOUNTER — Ambulatory Visit (HOSPITAL_COMMUNITY)
Admission: RE | Admit: 2016-11-08 | Discharge: 2016-11-09 | Disposition: A | Discharge status: Home / Self Care | Payer: Medicaid Other | Source: Ambulatory Visit | Attending: Internal Medicine | Admitting: Internal Medicine

## 2016-11-08 ENCOUNTER — Encounter (HOSPITAL_COMMUNITY)
Admission: RE | Disposition: A | Discharge status: Home / Self Care | Payer: Self-pay | Source: Ambulatory Visit | Attending: Internal Medicine

## 2016-11-08 DIAGNOSIS — Z793 Long term (current) use of hormonal contraceptives: Secondary | ICD-10-CM | POA: Insufficient documentation

## 2016-11-08 DIAGNOSIS — R58 Hemorrhage, not elsewhere classified: Secondary | ICD-10-CM | POA: Diagnosis not present

## 2016-11-08 DIAGNOSIS — I471 Supraventricular tachycardia: Secondary | ICD-10-CM | POA: Diagnosis present

## 2016-11-08 HISTORY — PX: ELECTROPHYSIOLOGIC STUDY: SHX172A

## 2016-11-08 LAB — POCT ACTIVATED CLOTTING TIME
ACTIVATED CLOTTING TIME: 202 s
ACTIVATED CLOTTING TIME: 158 s

## 2016-11-08 LAB — HCG, SERUM, QUALITATIVE: PREG SERUM: NEGATIVE

## 2016-11-08 SURGERY — SVT ABLATION
Anesthesia: LOCAL

## 2016-11-08 MED ORDER — ONDANSETRON HCL 4 MG/2ML IJ SOLN
4.0000 mg | Freq: Four times a day (QID) | INTRAMUSCULAR | Status: DC | PRN
  Administered 2016-11-08: 4 mg via INTRAVENOUS

## 2016-11-08 MED ORDER — ACETAMINOPHEN 325 MG PO TABS: 650.0000 mg | ORAL_TABLET | ORAL | Status: DC | PRN

## 2016-11-08 MED ORDER — ONDANSETRON HCL 4 MG/2ML IJ SOLN
INTRAMUSCULAR | Status: AC
  Filled 2016-11-08: qty 2

## 2016-11-08 MED ORDER — HEPARIN (PORCINE) IN NACL 2-0.9 UNIT/ML-% IJ SOLN
INTRAMUSCULAR | Status: DC | PRN
  Administered 2016-11-08 (×2): 500 mL

## 2016-11-08 MED ORDER — SODIUM CHLORIDE 0.9 % IV SOLN: 250.0000 mL | INTRAVENOUS | Status: DC | PRN

## 2016-11-08 MED ORDER — MIDAZOLAM HCL 5 MG/5ML IJ SOLN
INTRAMUSCULAR | Status: AC
  Filled 2016-11-08: qty 5

## 2016-11-08 MED ORDER — FENTANYL CITRATE (PF) 100 MCG/2ML IJ SOLN
INTRAMUSCULAR | Status: AC
  Filled 2016-11-08: qty 2

## 2016-11-08 MED ORDER — HEPARIN SODIUM (PORCINE) 1000 UNIT/ML IJ SOLN
INTRAMUSCULAR | Status: AC
  Filled 2016-11-08: qty 1

## 2016-11-08 MED ORDER — SODIUM CHLORIDE 0.9% FLUSH
3.0000 mL | Freq: Two times a day (BID) | INTRAVENOUS | Status: DC
  Administered 2016-11-08: 3 mL via INTRAVENOUS

## 2016-11-08 MED ORDER — BUPIVACAINE HCL (PF) 0.25 % IJ SOLN
INTRAMUSCULAR | Status: AC
  Filled 2016-11-08: qty 60

## 2016-11-08 MED ORDER — MIDAZOLAM HCL 5 MG/5ML IJ SOLN
INTRAMUSCULAR | Status: DC | PRN
  Administered 2016-11-08 (×5): 1 mg via INTRAVENOUS
  Administered 2016-11-08: 2 mg via INTRAVENOUS
  Administered 2016-11-08: 1 mg via INTRAVENOUS
  Administered 2016-11-08: 2 mg via INTRAVENOUS
  Administered 2016-11-08: 1 mg via INTRAVENOUS

## 2016-11-08 MED ORDER — FENTANYL CITRATE (PF) 100 MCG/2ML IJ SOLN
INTRAMUSCULAR | Status: DC | PRN
  Administered 2016-11-08 (×2): 12.5 ug via INTRAVENOUS
  Administered 2016-11-08: 25 ug via INTRAVENOUS
  Administered 2016-11-08: 12.5 ug via INTRAVENOUS

## 2016-11-08 MED ORDER — HEPARIN SODIUM (PORCINE) 1000 UNIT/ML IJ SOLN
INTRAMUSCULAR | Status: DC | PRN
  Administered 2016-11-08: 5000 [IU] via INTRAVENOUS

## 2016-11-08 MED ORDER — SODIUM CHLORIDE 0.9 % IV SOLN
INTRAVENOUS | Status: DC
  Administered 2016-11-08: 06:00:00 via INTRAVENOUS

## 2016-11-08 MED ORDER — BUPIVACAINE HCL (PF) 0.25 % IJ SOLN
INTRAMUSCULAR | Status: DC | PRN
  Administered 2016-11-08: 50 mL

## 2016-11-08 MED ORDER — SODIUM CHLORIDE 0.9% FLUSH: 3.0000 mL | INTRAVENOUS | Status: DC | PRN

## 2016-11-08 MED ORDER — ADULT MULTIVITAMIN W/MINERALS CH: 1.0000 | ORAL_TABLET | ORAL | Status: DC

## 2016-11-08 SURGICAL SUPPLY — 11 items
BAG SNAP BAND KOVER 36X36 (MISCELLANEOUS) ×2 IMPLANT
CATH HEX JOS 2-5-2 65CM 6F REP (CATHETERS) ×2 IMPLANT
CATH JOSEPH QUAD ALLRED 6F REP (CATHETERS) ×4 IMPLANT
CATH THERMISTOR 7FR 4MM (ABLATOR) ×2 IMPLANT
PACK EP LATEX FREE (CUSTOM PROCEDURE TRAY) ×1
PACK EP LF (CUSTOM PROCEDURE TRAY) ×1 IMPLANT
PAD DEFIB LIFELINK (PAD) ×2 IMPLANT
SHEATH PINNACLE 6F 10CM (SHEATH) ×4 IMPLANT
SHEATH PINNACLE 7F 10CM (SHEATH) ×2 IMPLANT
SHEATH PINNACLE 8F 10CM (SHEATH) ×4 IMPLANT
SHIELD RADPAD SCOOP 12X17 (MISCELLANEOUS) ×2 IMPLANT

## 2016-11-08 NOTE — Progress Notes (Signed)
Manual pressure reapplied for 15 mins after rebleed of right groin.   Noted brusing at site site , no hematoma,  Tender to touch.  Pressure dressing applied.

## 2016-11-08 NOTE — Progress Notes (Signed)
Site area: Right Internal jugular a 7 french sheath was removed  Site Prior to Removal:  Level 0  Pressure Applied For 10 MINUTES    Bedrest  Beginning at 1115am  Manual:   Yes.    Patient Status During Pull:  stable  Post Pull Groin Site:  Level 0  Post Pull Instructions Given:  Yes.    Post Pull Pulses Present:  Yes.    Dressing Applied:  Yes.    Comments:  VS remain stable during sheath pull

## 2016-11-08 NOTE — H&P (Signed)
HPI Ms. Cowing is referred today for evaluation of an almost wide QRS tachycardia. She had her first episode of heart racing at age 25. Since then they have occurred infrequently. However over the past few months she has had more. The last episode lasted about 3 hours. An ecg shows and almost wide QRS tachycardia at 206/min. The episode stopped suddenly just prior to her receiving IV adenosine. She has not been able to take beta blockers because of a relatively low blood pressure.       Allergies  Allergen Reactions  . Lactose Intolerance (Gi) Other (See Comments)    GI upset           Current Outpatient Prescriptions  Medication Sig Dispense Refill  . Pseudoephedrine-APAP-DM (DAYQUIL PO) Take 1 capsule by mouth daily as needed (Cold and/or sinus relief). DAYQUIL COLD & SINUS     No current facility-administered medications for this visit.          Past Medical History:  Diagnosis Date  . Abnormal Pap smear   . Dysrhythmia    hx palpitations had echo 2009 ?SVT  . History of chicken pox   . Kidney calculi   . Kidney stones   . Lactose intolerance    but does consume dairy    ROS:   All systems reviewed and negative except as noted in the HPI.        Past Surgical History:  Procedure Laterality Date  . CESAREAN SECTION  01/28/2012   Procedure: CESAREAN SECTION;  Surgeon: Juluis MireJohn S McComb, MD;  Location: [facility];  Service: Gynecology;  Laterality: N/A;  Primary cesarean section with delivery of baby  boy at [id number]. Apgars 9/9.  Marland Kitchen. COLPOSCOPY  2009  . RENAL ARTERY STENT  10/12   Removed after 1 month  . WISDOM TOOTH EXTRACTION            Family History  Problem Relation Age of Onset  . Hyperlipidemia Mother   . Anesthesia problems Neg Hx   . Hypotension Neg Hx   . Malignant hyperthermia Neg Hx   . Pseudochol deficiency Neg Hx      Social History        Social History  . Marital status: Single    Spouse name: N/A  .  Number of children: N/A  . Years of education: N/A      Occupational History  . Not on file.         Social History Main Topics  . Smoking status: Never Smoker  . Smokeless tobacco: Never Used  . Alcohol use 1.2 - 3.6 oz/week    1 - 3 Glasses of wine, 1 - 3 Cans of beer per week     Comment: socially  . Drug use: No  . Sexual activity: Yes       Other Topics Concern  . Not on file      Social History Narrative  . No narrative on file     BP 108/81   Pulse 87   Ht 5' 0.25" (1.53 m)   Wt 114 lb (51.7 kg)   BMI 22.08 kg/m   Physical Exam:  Well appearing 25 yo woman, NAD HEENT: Unremarkable Neck:  No JVD, no thyromegally Lymphatics:  No adenopathy Back:  No CVA tenderness Lungs:  Clear with no wheezes HEART:  Regular rate rhythm, no murmurs, no rubs, no clicks Abd:  soft, positive bowel sounds, no organomegally, no rebound, no guarding Ext:  2 plus pulses,  no edema, no cyanosis, no clubbing Skin:  No rashes no nodules Neuro:  CN II through XII intact, motor grossly intact  EKG NSR with no pre-excitations  Assess/Plan: 1. 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 I have discussed the treatment options with the patient and her father. The risks/benefits/goals/expectations of catheter ablation were reviewed. She will call us if she wishes to proceed.  I spent 45 minutes with the patient and preparation of the note with over 50% face to face with the patient.  Gerhard MunchGregg Natalia Wittmeyer,M.D.  EP Attending  Patient seen and examined. Agree with above. Since her last visit, no change in the history, exam, assessment and plan. I have reviewed the indications for the procedure and she wishes to proceed.  Leonia ReevesGregg Shaheen Star,M.D.

## 2016-11-08 NOTE — Progress Notes (Addendum)
Site area: Right groin a 6 french X2 , 8 french venous sheaths and 8 french arterial sheath was removed  Site Prior to Removal:  Level 0  Pressure Applied For 20 MINUTES    Bedrest Beginning at 1115 am  Manual:   Yes.    Patient Status During Pull:  stable  Post Pull Groin Site:  Level 0  Post Pull Instructions Given:  Yes.    Post Pull Pulses Present:  Yes.    Dressing Applied:  Yes.    Comments:  VS remain stable during sheath pull

## 2016-11-09 ENCOUNTER — Other Ambulatory Visit: Payer: Self-pay

## 2016-11-09 DIAGNOSIS — I471 Supraventricular tachycardia: Secondary | ICD-10-CM | POA: Diagnosis not present

## 2016-11-09 DIAGNOSIS — R58 Hemorrhage, not elsewhere classified: Secondary | ICD-10-CM | POA: Diagnosis not present

## 2016-11-09 DIAGNOSIS — Z793 Long term (current) use of hormonal contraceptives: Secondary | ICD-10-CM | POA: Diagnosis not present

## 2016-11-09 NOTE — Progress Notes (Signed)
    Patient Name: Rebecca Christensen Date of Encounter: 11/09/2016     Active Problems:   SVT (supraventricular tachycardia) (HCC)    SUBJECTIVE  Sore but no chest pain. " I didn't sleep last night"  CURRENT MEDS . multivitamin with minerals  1 tablet Oral Once per day on Mon Wed Fri  . sodium chloride flush  3 mL Intravenous Q12H    OBJECTIVE  Vitals:   11/08/16 1624 11/08/16 1724 11/08/16 2112 11/09/16 0519  BP: 105/69 112/79 103/61 (!) 96/53  Pulse: (!) 102 89 89 93  Resp: (!) 22 (!) 24 20   Temp:   98.6 F (37 C) 98.8 F (37.1 C)  TempSrc:   Oral Oral  SpO2: 100% 100% 100% 99%  Weight:    112 lb (50.8 kg)  Height:        Intake/Output Summary (Last 24 hours) at 11/09/16 0953 Last data filed at 11/09/16 0933  Gross per 24 hour  Intake              123 ml  Output                0 ml  Net              123 ml   Filed Weights   11/08/16 0546 11/09/16 0519  Weight: 115 lb (52.2 kg) 112 lb (50.8 kg)    PHYSICAL EXAM  General: Pleasant, well appearing young woman, NAD. Neuro: Alert and oriented X 3. Moves all extremities spontaneously. Psych: Normal affect. HEENT:  Normal  Neck: Supple without bruits or JVD. Lungs:  Resp regular and unlabored, CTA. Heart: RRR no s3, s4, or murmurs. Abdomen: Soft, non-tender, non-distended, BS + x 4.  Extremities: No clubbing, cyanosis or edema. DP/PT/Radials 2+ and equal bilaterally. 6x8 cm ecchymosis area with no hematom. No thrill or bruit. Minimally tender  Accessory Clinical Findings  CBC No results for input(s): WBC, NEUTROABS, HGB, HCT, MCV, PLT in the last 72 hours. Basic Metabolic Panel No results for input(s): NA, K, CL, CO2, GLUCOSE, BUN, CREATININE, CALCIUM, MG, PHOS in the last 72 hours. Liver Function Tests No results for input(s): AST, ALT, ALKPHOS, BILITOT, PROT, ALBUMIN in the last 72 hours. No results for input(s): LIPASE, AMYLASE in the last 72 hours. Cardiac Enzymes No results for input(s): CKTOTAL, CKMB,  CKMBINDEX, TROPONINI in the last 72 hours. BNP Invalid input(s): POCBNP D-Dimer No results for input(s): DDIMER in the last 72 hours. Hemoglobin A1C No results for input(s): HGBA1C in the last 72 hours. Fasting Lipid Panel No results for input(s): CHOL, HDL, LDLCALC, TRIG, CHOLHDL, LDLDIRECT in the last 72 hours. Thyroid Function Tests No results for input(s): TSH, T4TOTAL, T3FREE, THYROIDAB in the last 72 hours.  Invalid input(s): FREET3  TELE  nsr and sinus tachy  Radiology/Studies  No results found.  ASSESSMENT AND PLAN  1. 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 2. Post op ecchymosis - she has no swelling. No evidence of pseudoaneurysm or fistula on exam. Will follow. She should walk daily but no heavy lifting at discharge for a week.   Kahlee Metivier,M.D.  Sharlot GowdaGregg Meah Jiron,M.D.  11/09/2016 9:53 AMPatient ID: Rebecca Christensen, female   DOB: 01/08/1991, 25 y.o.   MRN: [id number]

## 2016-11-09 NOTE — Discharge Summary (Signed)
     Discharge Summary    Patient ID: Rebecca Christensen,  MRN: [id number], DOB/AGE: 26/05/1991 26 y.o.  Admit date: 11/08/2016 Discharge date: 11/09/2016  Primary Care Provider: Jaclyn ShaggyEnobong, Amao Primary Cardiologist: Kinsley Holderman MD  Discharge Diagnoses    Active Problems:   SVT (supraventricular tachycardia) (HCC)   AVNRT/ AVRT   S/P AV ablation   Allergies Allergies  Allergen Reactions  . Lactose Intolerance (Gi) Other (See Comments)    GI upset    Diagnostic Studies/Procedures    1.S/P AV Ablation _____________   History of Present Illness    [age] year old female with history of wide complex QRS tachycardia, initially diagnosed at age [age]. She was diagnosed with AVNRT and AVRT by Dr. Ladona Ridgelaylor. Scheduled for a AV nodal ablation. This was completed on 11/08/2016. This was successful,. No new medications were provided. She had some postop ecchymosis without swelling evidence of pseudoaneurysm or fistula on exam. The patient was given post procedure instructions to walk daily but no heavy lifting at discharge. She will have a follow-up appointment with Dr. Ladona Ridgelaylor in the pacemaker clinic for ongoing evaluation and management.   Hospital Course   Admitted for AV nodal ablation. Completed on 11/08/2016. This was successful. Please see EP studyis they are currently not available at this time she had some postoperative ecchymosis without active bleeding or pseudoaneurysm. She being discharged home with no new medications she will have close follow-up in pacemaker clinic.    _____________  Discharge Vitals Blood pressure (!) 96/53, pulse 93, temperature 98.8 F (37.1 C), temperature source Oral, resp. rate 20, height 5\' 1"  (1.549 m), weight 112 lb (50.8 kg), SpO2 99 %, unknown if currently breastfeeding.  Filed Weights   11/08/16 0546 11/09/16 0519  Weight: 115 lb (52.2 kg) 112 lb (50.8 kg)    Labs & Radiologic Studies      No results found.  Disposition   Pt is being discharged  home today in good condition.  Follow-up Plans & Appointments    Follow-up Information    Lewayne BuntingGregg Brie Eppard, MD Follow up.   Specialty:  Cardiology Why:  Our office will call you for appointment Contact information: [address] [place] [address] [phone]          Discharge Instructions    Discharge wound care:    Complete by:  As directed       Discharge Medications   Current Discharge Medication List    CONTINUE these medications which have NOT CHANGED   Details  NUVARING 0.12-0.015 MG/24HR vaginal ring INSERT 1 RING VAGINALLY AND LEAVE IN PLACE FOR 3 WEEKS, REMOVE FOR 1 WEEK THEN REPEAT Refills: 3    Multiple Vitamin (MULTIVITAMIN WITH MINERALS) TABS tablet Take 1 tablet by mouth 3 (three) times a week.      STOP taking these medications     Aspirin-Salicylamide-Caffeine (BC HEADACHE POWDER PO)           Outstanding Labs/Studies   None  Duration of Discharge Encounter   Greater than 30 minutes including physician time.  Signed, Joni ReiningKathryn Lawrence NP 11/09/2016, 10:02 AM   EP Attending  Patient seen and examined. See my note. She is stable for discharge home. She has an area of ecchymosis. No swelling though. She is stable for dc home. Careful followup.  Leonia ReevesGregg Shyhiem Beeney,M.D.

## 2016-11-21 ENCOUNTER — Encounter: Payer: Self-pay | Admitting: Internal Medicine

## 2016-12-05 ENCOUNTER — Encounter: Payer: Self-pay | Admitting: Internal Medicine

## 2016-12-05 ENCOUNTER — Ambulatory Visit (INDEPENDENT_AMBULATORY_CARE_PROVIDER_SITE_OTHER): Payer: Medicaid Other | Admitting: Internal Medicine

## 2016-12-05 VITALS — BP 100/60 | HR 84 | Ht 60.0 in | Wt 119.6 lb

## 2016-12-05 DIAGNOSIS — I471 Supraventricular tachycardia: Secondary | ICD-10-CM | POA: Diagnosis not present

## 2016-12-05 NOTE — Progress Notes (Signed)
HPI Rebecca Christensen returns today for followup, s/p ablation of both AVNRT and AVRT. She has done well in the interim with no recurrent SVT. She has had no palpitations.  Allergies  Allergen Reactions  . Lactose Intolerance (Gi) Other (See Comments)    GI upset     Current Outpatient Prescriptions  Medication Sig Dispense Refill  . Multiple Vitamin (MULTIVITAMIN WITH MINERALS) TABS tablet Take 1 tablet by mouth 3 (three) times a week.    Marland Kitchen. NUVARING 0.12-0.015 MG/24HR vaginal ring INSERT 1 RING VAGINALLY AND LEAVE IN PLACE FOR 3 WEEKS, REMOVE FOR 1 WEEK THEN REPEAT  3   No current facility-administered medications for this visit.      Past Medical History:  Diagnosis Date  . Abnormal Pap smear   . Dysrhythmia    hx palpitations had echo 2009 ?SVT  . History of chicken pox   . Kidney calculi   . Kidney stones   . Lactose intolerance    but does consume dairy    ROS:   All systems reviewed and negative except as noted in the HPI.   Past Surgical History:  Procedure Laterality Date  . CESAREAN SECTION  01/28/2012   Procedure: CESAREAN SECTION;  Surgeon: Juluis MireJohn S McComb, MD;  Location: [facility];  Service: Gynecology;  Laterality: N/A;  Primary cesarean section with delivery of baby  boy at [id number]. Apgars 9/9.  Marland Kitchen. COLPOSCOPY  2009  . ELECTROPHYSIOLOGIC STUDY N/A 11/08/2016   Procedure: SVT Ablation;  Surgeon: Marinus MawGregg W Darnell Stimson, MD;  [facility];  Service: Cardiovascular;  Laterality: N/A;  . RENAL ARTERY STENT  10/12   Removed after 1 month  . WISDOM TOOTH EXTRACTION       Family History  Problem Relation Age of Onset  . Hyperlipidemia Mother   . Anesthesia problems Neg Hx   . Hypotension Neg Hx   . Malignant hyperthermia Neg Hx   . Pseudochol deficiency Neg Hx      Social History   Social History  . Marital status: Single    Spouse name: N/A  . Number of children: N/A  . Years of education: N/A   Occupational History  . Not on file.   Social  History Main Topics  . Smoking status: Never Smoker  . Smokeless tobacco: Never Used  . Alcohol use 1.2 - 3.6 oz/week    1 - 3 Glasses of wine, 1 - 3 Cans of beer per week     Comment: socially  . Drug use: No  . Sexual activity: Yes   Other Topics Concern  . Not on file   Social History Narrative  . No narrative on file     BP 100/60   Pulse 84   Ht 5' (1.524 m)   Wt 119 lb 9.6 oz (54.3 kg)   LMP 11/15/2016 (Approximate)   SpO2 98%   Breastfeeding? No   BMI 23.36 kg/m   Physical Exam:  Well appearing 25 yo woman, NAD HEENT: Unremarkable Neck:  6 cm JVD, no thyromegally Lymphatics:  No adenopathy Back:  No CVA tenderness Lungs:  Clear with no wheezes HEART:  Regular rate rhythm, no murmurs, no rubs, no clicks Abd:  soft, positive bowel sounds, no organomegally, no rebound, no guarding Ext:  2 plus pulses, no edema, no cyanosis, no clubbing Skin:  No rashes no nodules Neuro:  CN II through XII intact, motor grossly intact  EKG NSR with no pre-excitations  Assess/Plan: 1. SVT - she has undergone successful ablation and is doing well. She will followup as needed.   Leonia ReevesGregg Shariff Lasky,M.D.

## 2016-12-05 NOTE — Patient Instructions (Signed)
Medication Instructions:  Your physician recommends that you continue on your current medications as directed. Please refer to the Current Medication list given to you today.   Labwork: None Ordered   Testing/Procedures: None Ordered   Follow-Up: Follow-up with Dr. Taylor as needed    Any Other Special Instructions Will Be Listed Below (If Applicable).     If you need a refill on your cardiac medications before your next appointment, please call your pharmacy.   

## 2017-04-25 ENCOUNTER — Ambulatory Visit (INDEPENDENT_AMBULATORY_CARE_PROVIDER_SITE_OTHER): Payer: Medicaid Other | Admitting: Physician Assistant

## 2017-04-25 ENCOUNTER — Encounter (INDEPENDENT_AMBULATORY_CARE_PROVIDER_SITE_OTHER): Payer: Self-pay | Admitting: Physician Assistant

## 2017-04-25 ENCOUNTER — Ambulatory Visit (HOSPITAL_COMMUNITY)
Admission: RE | Admit: 2017-04-25 | Discharge: 2017-04-25 | Disposition: A | Discharge status: Home / Self Care | Payer: Medicaid Other | Source: Ambulatory Visit | Attending: Physician Assistant | Admitting: Physician Assistant

## 2017-04-25 VITALS — BP 101/69 | HR 74 | Temp 98.1°F | Ht 61.0 in | Wt 112.8 lb

## 2017-04-25 DIAGNOSIS — R938 Abnormal findings on diagnostic imaging of other specified body structures: Secondary | ICD-10-CM

## 2017-04-25 DIAGNOSIS — M4184 Other forms of scoliosis, thoracic region: Secondary | ICD-10-CM | POA: Diagnosis not present

## 2017-04-25 DIAGNOSIS — M542 Cervicalgia: Secondary | ICD-10-CM

## 2017-04-25 DIAGNOSIS — G44209 Tension-type headache, unspecified, not intractable: Secondary | ICD-10-CM | POA: Diagnosis not present

## 2017-04-25 DIAGNOSIS — M549 Dorsalgia, unspecified: Secondary | ICD-10-CM

## 2017-04-25 DIAGNOSIS — R6889 Other general symptoms and signs: Secondary | ICD-10-CM | POA: Diagnosis not present

## 2017-04-25 DIAGNOSIS — R9389 Abnormal findings on diagnostic imaging of other specified body structures: Secondary | ICD-10-CM

## 2017-04-25 DIAGNOSIS — R5383 Other fatigue: Secondary | ICD-10-CM

## 2017-04-25 DIAGNOSIS — G4700 Insomnia, unspecified: Secondary | ICD-10-CM | POA: Diagnosis not present

## 2017-04-25 DIAGNOSIS — Z23 Encounter for immunization: Secondary | ICD-10-CM

## 2017-04-25 MED ORDER — CYCLOBENZAPRINE HCL 10 MG PO TABS: 10.0000 mg | ORAL_TABLET | Freq: Every day | ORAL | 1 refills | Status: DC

## 2017-04-25 MED ORDER — NAPROXEN 500 MG PO TABS: 500.0000 mg | ORAL_TABLET | Freq: Two times a day (BID) | ORAL | 0 refills | Status: DC

## 2017-04-25 NOTE — Patient Instructions (Signed)

## 2017-04-25 NOTE — Progress Notes (Signed)
Subjective:  Patient ID: Rebecca Christensen, female    DOB: May 09, 1991  Age: 26 y.o. MRN: [id number]  CC: thyroid concern  HPI Rebecca Christensen is a 26 y.o. female with a PMH of renal stones and dysrhythmia s/p ablation 11/2016 presents with concern for thyroid issues. Her chiropractor saw a "calcification" in the area of the thyroid on radiographs. He recommended she have evaluation at PCP. Feels fatigue, cold intolerance, and insomnia. No menstrual issues due to Nuvaring. No family hx of thyroid disease. Goes to the chiropractor because she hurt her neck in a "bouncy castle" on October of 2016. Feels that chiropractor manipulation helps her neck and back pain. Endorses occasional headache in the occiput and behind the eyes. Associated phonophobia and photophobia. Relieved with bed rest. Does not endorse any other symptoms.     Outpatient Medications Prior to Visit  Medication Sig Dispense Refill  . Multiple Vitamin (MULTIVITAMIN WITH MINERALS) TABS tablet Take 1 tablet by mouth 3 (three) times a week.    Marland Kitchen NUVARING 0.12-0.015 MG/24HR vaginal ring INSERT 1 RING VAGINALLY AND LEAVE IN PLACE FOR 3 WEEKS, REMOVE FOR 1 WEEK THEN REPEAT  3   No facility-administered medications prior to visit.      ROS Review of Systems  Constitutional: Positive for malaise/fatigue. Negative for chills and fever.  Eyes: Negative for blurred vision.  Respiratory: Negative for shortness of breath.   Cardiovascular: Negative for chest pain and palpitations.  Gastrointestinal: Negative for abdominal pain and nausea.  Genitourinary: Negative for dysuria and hematuria.  Musculoskeletal: Positive for back pain and neck pain. Negative for joint pain and myalgias.  Skin: Negative for rash.  Neurological: Positive for headaches. Negative for tingling.  Endo/Heme/Allergies:       Cold intolerance  Psychiatric/Behavioral: Negative for depression. The patient has insomnia. The patient is not nervous/anxious.     Objective:   BP 101/69 (BP Location: Left Arm, Patient Position: Sitting, Cuff Size: Normal)   Pulse 74   Temp 98.1 F (36.7 C) (Oral)   Ht 5\' 1"  (1.549 m)   Wt 112 lb 12.8 oz (51.2 kg)   LMP 04/02/2017 (Approximate)   SpO2 100%   BMI 21.31 kg/m   BP/Weight 04/25/2017 12/05/2016 11/09/2016  Systolic BP 101 100 96  Diastolic BP 69 60 53  Wt. (Lbs) 112.8 119.6 112  BMI 21.31 23.36 -      Physical Exam  Constitutional: She is oriented to person, place, and time.  Well developed, well nourished, NAD, polite  HENT:  Head: Normocephalic and atraumatic.  Eyes: No scleral icterus.  Neck: Normal range of motion. Neck supple. No thyromegaly present.  Cardiovascular: Normal rate, regular rhythm and normal heart sounds.   Pulmonary/Chest: Effort normal and breath sounds normal.  Abdominal: Soft. Bowel sounds are normal. There is no tenderness.  Musculoskeletal: She exhibits no edema or deformity.  Mild TTP of the lower C spine and upper T spine.  Neurological: She is alert and oriented to person, place, and time.  Skin: Skin is warm and dry. No rash noted. No erythema. No pallor.  Psychiatric: She has a normal mood and affect. Her behavior is normal. Thought content normal.  Vitals reviewed.    Assessment & Plan:   1. Abnormal radiological findings in skin and subcutaneous tissue - Chiropractor found "calcification" in the area of the thyroid.    2. Need for Tdap vaccination - Tdap vaccine greater than or equal to 7yo IM  3. Fatigue, unspecified type - TSH - CBC  with Differential - Comprehensive metabolic panel  4. Cold intolerance - TSH - CBC with Differential - Comprehensive metabolic panel  5. Insomnia, unspecified type - TSH - CBC with Differential - Comprehensive metabolic panel  6. Cervicalgia - DG Cervical Spine Complete; Future  7. Mid back pain - DG Thoracic Spine 2 View; Future  8. Tension-type headache, not intractable, unspecified chronicity pattern - Begin  cyclobenzaprine (FLEXERIL) 10 MG tablet; Take 1 tablet (10 mg total) by mouth at bedtime.  Dispense: 15 tablet; Refill: 1 - Begin naproxen (NAPROSYN) 500 MG tablet; Take 1 tablet (500 mg total) by mouth 2 (two) times daily with a meal.  Dispense: 60 tablet; Refill: 0   Meds ordered this encounter  Medications  . cyclobenzaprine (FLEXERIL) 10 MG tablet    Sig: Take 1 tablet (10 mg total) by mouth at bedtime.    Dispense:  15 tablet    Refill:  1    Order Specific Question:   Supervising Provider    Answer:   Quentin AngstJEGEDE, OLUGBEMIGA E [id number]]  . naproxen (NAPROSYN) 500 MG tablet    Sig: Take 1 tablet (500 mg total) by mouth 2 (two) times daily with a meal.    Dispense:  60 tablet    Refill:  0    Order Specific Question:   Supervising Provider    AnswerQuentin Angst:   JEGEDE, OLUGBEMIGA E [[id number]]    Follow-up: 4 weeks for full physical  Loletta Specteroger David Jeanny Rymer PA

## 2017-04-26 LAB — TSH: TSH: 2.79 u[IU]/mL (ref 0.450–4.500)

## 2017-04-26 LAB — COMPREHENSIVE METABOLIC PANEL
ALBUMIN: 3.7 g/dL (ref 3.5–5.5)
ALK PHOS: 32 IU/L — AB (ref 39–117)
ALT: 12 IU/L (ref 0–32)
AST: 13 IU/L (ref 0–40)
Albumin/Globulin Ratio: 1.6 (ref 1.2–2.2)
BILIRUBIN TOTAL: 0.3 mg/dL (ref 0.0–1.2)
BUN/Creatinine Ratio: 15 (ref 9–23)
BUN: 12 mg/dL (ref 6–20)
CALCIUM: 8.8 mg/dL (ref 8.7–10.2)
CHLORIDE: 103 mmol/L (ref 96–106)
CO2: 22 mmol/L (ref 18–29)
CREATININE: 0.79 mg/dL (ref 0.57–1.00)
GFR calc Af Amer: 119 mL/min/{1.73_m2} (ref >59)
GFR calc non Af Amer: 103 mL/min/{1.73_m2} (ref >59)
Globulin, Total: 2.3 g/dL (ref 1.5–4.5)
Glucose: 81 mg/dL (ref 65–99)
Potassium: 4.4 mmol/L (ref 3.5–5.2)
Sodium: 140 mmol/L (ref 134–144)
Total Protein: 6 g/dL (ref 6.0–8.5)

## 2017-04-26 LAB — CBC WITH DIFFERENTIAL/PLATELET
BASOS: 0 %
Basophils Absolute: 0 10*3/uL (ref 0.0–0.2)
EOS (ABSOLUTE): 0.1 10*3/uL (ref 0.0–0.4)
EOS: 1 %
HEMATOCRIT: 39.5 % (ref 34.0–46.6)
Hemoglobin: 12.9 g/dL (ref 11.1–15.9)
Immature Grans (Abs): 0 10*3/uL (ref 0.0–0.1)
Immature Granulocytes: 0 %
LYMPHS ABS: 1.7 10*3/uL (ref 0.7–3.1)
Lymphs: 24 %
MCH: 28 pg (ref 26.6–33.0)
MCHC: 32.7 g/dL (ref 31.5–35.7)
MCV: 86 fL (ref 79–97)
MONOS ABS: 0.4 10*3/uL (ref 0.1–0.9)
Monocytes: 6 %
Neutrophils Absolute: 5.1 10*3/uL (ref 1.4–7.0)
Neutrophils: 69 %
Platelets: 348 10*3/uL (ref 150–379)
RBC: 4.61 x10E6/uL (ref 3.77–5.28)
RDW: 13.3 % (ref 12.3–15.4)
WBC: 7.4 10*3/uL (ref 3.4–10.8)

## 2017-05-21 ENCOUNTER — Ambulatory Visit: Payer: Medicaid Other | Admitting: Neurology

## 2017-05-22 ENCOUNTER — Encounter: Payer: Self-pay | Admitting: Neurology

## 2017-05-22 ENCOUNTER — Ambulatory Visit (INDEPENDENT_AMBULATORY_CARE_PROVIDER_SITE_OTHER): Payer: Medicaid Other | Admitting: Neurology

## 2017-05-22 VITALS — BP 108/78 | HR 86 | Ht 60.0 in | Wt 116.0 lb

## 2017-05-22 DIAGNOSIS — G43009 Migraine without aura, not intractable, without status migrainosus: Secondary | ICD-10-CM

## 2017-05-22 DIAGNOSIS — R51 Headache: Secondary | ICD-10-CM

## 2017-05-22 DIAGNOSIS — R519 Headache, unspecified: Secondary | ICD-10-CM

## 2017-05-22 MED ORDER — BUTALBITAL-APAP-CAFFEINE 50-325-40 MG PO TABS: 1.0000 | ORAL_TABLET | Freq: Four times a day (QID) | ORAL | 1 refills | Status: DC | PRN

## 2017-05-22 NOTE — Progress Notes (Signed)
Subjective:    Patient ID: Rebecca Christensen is a 26 y.o. female.  HPI     Rebecca FoleySaima Sherman Lipuma, MD, PhD [facility] [address], [address] [address] [place], [place] [address]  Dear Dr. Conley RollsLe,   I saw your patient, Rebecca Christensen, upon your kind request in my neurologic clinic today for initial consultation of her recurrent headaches. The patient is unaccompanied today. As you know, Rebecca Christensen is a 26 year old right-handed woman with an underlying medical history of SVT, kidney stones, status post renal artery stenting in 2012 with removal subsequently, status post ablation for her SVT on 11/08/2016, who reports recurrent headaches for the past many years, she is nearly daily headaches, these stay in the front and top area of her head, feeling like a tension or pressure like sensation. She also has infrequent migraines which are one-sided, throbbing, more severe, associated with nausea, vomiting and photophobia. She has a migraine without aura typically and this happens about once a month or so. She does not take daily over-the-counter headache medication, tries to avoid taking medication on a regular basis. She does not typically miss work for her nearly daily headaches. She has no associated neurological accompaniment. In particular, she denies any one-sided weakness or numbness or droopy face or slurring of speech. She may have a family history of migraines but is not sure, both sets of grandparents died when she was young. She tries to reduce stressors. She lives with her 27-year-old son. She is divorced. She is a nonsmoker and drinks alcohol maybe once a month or so, does not necessarily drink caffeine on a daily basis. Because of her kidney stone history she has reduced her caffeine intake. She had a recent routine eye exam on 04/25/2017 with normal ocular health reported, I reviewed your office note from 04/25/2017 which showed normal eye pressure, dilated fundus exam revealed normal  findings. Corrected visual acuities were 20/20 bilaterally, she got a new prescription for eyeglasses. She denies snoring or difficulty with sleep disruption. She denies restless leg symptoms. She tries to hydrate well with water. She has never had a brain scan.  Her Past Medical History Is Significant For: Past Medical History:  Diagnosis Date  . Abnormal Pap smear   . Dysrhythmia    hx palpitations had echo 2009 ?SVT  . History of chicken pox   . Kidney calculi   . Kidney stones   . Lactose intolerance    but does consume dairy    Her Past Surgical History Is Significant For: Past Surgical History:  Procedure Laterality Date  . CESAREAN SECTION  01/28/2012   Procedure: CESAREAN SECTION;  Surgeon: Juluis MireJohn S McComb, MD;  Location: [facility];  Service: Gynecology;  Laterality: N/A;  Primary cesarean section with delivery of baby  boy at [id number]. Apgars 9/9.  Marland Kitchen. COLPOSCOPY  2009  . ELECTROPHYSIOLOGIC STUDY N/A 11/08/2016   Procedure: SVT Ablation;  Surgeon: Marinus MawGregg W Taylor, MD;  [facility];  Service: Cardiovascular;  Laterality: N/A;  . RENAL ARTERY STENT  10/12   Removed after 1 month  . WISDOM TOOTH EXTRACTION      Her Family History Is Significant For: Family History  Problem Relation Age of Onset  . Hyperlipidemia Mother   . Anesthesia problems Neg Hx   . Hypotension Neg Hx   . Malignant hyperthermia Neg Hx   . Pseudochol deficiency Neg Hx     Her Social History Is Significant For: Social History   Social  History  . Marital status: Single    Spouse name: N/A  . Number of children: N/A  . Years of education: N/A   Social History Main Topics  . Smoking status: Never Smoker  . Smokeless tobacco: Never Used  . Alcohol use 1.2 - 3.6 oz/week    1 - 3 Glasses of wine, 1 - 3 Cans of beer per week     Comment: socially  . Drug use: No  . Sexual activity: Yes   Other Topics Concern  . None   Social History Narrative  . None    Her Allergies Are:   Allergies  Allergen Reactions  . Lactose Intolerance (Gi) Other (See Comments)    GI upset  :   Her Current Medications Are:  Outpatient Encounter Prescriptions as of 05/22/2017  Medication Sig  . Multiple Vitamin (MULTIVITAMIN WITH MINERALS) TABS tablet Take 1 tablet by mouth 3 (three) times a week.  Marland Kitchen NUVARING 0.12-0.015 MG/24HR vaginal ring INSERT 1 RING VAGINALLY AND LEAVE IN PLACE FOR 3 WEEKS, REMOVE FOR 1 WEEK THEN REPEAT  . [DISCONTINUED] cyclobenzaprine (FLEXERIL) 10 MG tablet Take 1 tablet (10 mg total) by mouth at bedtime.  . [DISCONTINUED] naproxen (NAPROSYN) 500 MG tablet Take 1 tablet (500 mg total) by mouth 2 (two) times daily with a meal.   No facility-administered encounter medications on file as of 05/22/2017.   :  Review of Systems:  Out of a complete 14 point review of systems, all are reviewed and negative with the exception of these symptoms as listed be Review of Systems  Neurological:       Pt presents today to discuss her headaches. Pt has been to the eye doctor and given glasses but she still has daily headaches. She has tried [facility] powder and tylenol but they do not help her headache.    Objective:  Neurologic Exam  Physical Exam Physical Examination:   Vitals:   05/22/17 1440  BP: 108/78  Pulse: 86    General Examination: The patient is a very pleasant 26 y.o. female in no acute distress. She appears well-developed and well-nourished and well groomed.   HEENT: Normocephalic, atraumatic, pupils are equal, round and reactive to light and accommodation. Funduscopic exam is normal with sharp disc margins noted. Extraocular tracking is good without limitation to gaze excursion or nystagmus noted. Normal smooth pursuit is noted. Hearing is grossly intact. Face is symmetric with normal facial animation and normal facial sensation. Speech is clear with no dysarthria noted. There is no hypophonia. There is no lip, neck/head, jaw or voice tremor. Neck is supple  with full range of passive and active motion. There are no carotid bruits on auscultation. Oropharynx exam reveals: no mouth dryness, good dental hygiene and no significant airway crowding. Tongue protrudes centrally and palate elevates symmetrically.    Chest: Clear to auscultation without wheezing, rhonchi or crackles noted.  Heart: S1+S2+0, regular and normal without murmurs, rubs or gallops noted.   Abdomen: Soft, non-tender and non-distended with normal bowel sounds appreciated on auscultation.  Extremities: There is no pitting edema in the distal lower extremities bilaterally. Pedal pulses are intact.  Skin: Warm and dry without trophic changes noted.  Musculoskeletal: exam reveals no obvious joint deformities, tenderness or joint swelling or erythema.   Neurologically:  Mental status: The patient is awake, alert and oriented in all 4 spheres. Her immediate and remote memory, attention, language skills and fund of knowledge are appropriate. There is no evidence of aphasia, agnosia,  apraxia or anomia. Speech is clear with normal prosody and enunciation. Thought process is linear. Mood is normal and affect is normal.  Cranial nerves II - XII are as described above under HEENT exam. In addition: shoulder shrug is normal with equal shoulder height noted. Motor exam: Normal bulk, strength and tone is noted. There is no drift, tremor or rebound. Romberg is negative. Reflexes are 2+ throughout. Fine motor skills and coordination: intact with normal finger taps, normal hand movements, normal rapid alternating patting, normal foot taps and normal foot agility.  Cerebellar testing: No dysmetria or intention tremor on finger to nose testing. Heel to shin is unremarkable bilaterally. There is no truncal or gait ataxia.  Sensory exam: intact to light touch, vibration, temperature in the upper and lower extremities.  Gait, station and balance: She stands easily. No veering to one side is noted. No  leaning to one side is noted. Posture is age-appropriate and stance is narrow based. Gait shows normal stride length and normal pace. No problems turning are noted. Tandem walk is unremarkable.  Assessment and Plan:   In summary, Rebecca Christensen is a very pleasant 26 y.o.-year old female  with an underlying medical history of SVT, kidney stones, status post renal artery stenting in 2012 with removal subsequently, status post ablation for her SVT on 11/08/2016, whoPresents for initial evaluation of her recurrent headaches and infrequent migraine headaches. She has nearly daily tension-type headaches and migraines perhaps once a month, this does not warrant migraine prevention treatment. For her daily headaches she is advised to avoid daily pain medication, avoid caffeine overuse, increase and ensure proper water intake and good rest and enough sleep time. She has worked on her stress reduction which she found has been helpful including exercising on a daily or regular basis. Physical exam and particularly her neurological exam are nonfocal which is reassuring. She is advised for as needed use she could try Fioricet and I gave her a prescription but did caution her that Fioricet is not for daily use and can be addicting and sedating. She is agreeable to trying it. She declined a prescription for nausea medicine at this time.  I had a long chat with the patient about my findings and the diagnosis of migraine and daily HAs, the prognosis and treatment options. We talked about medical treatments and non-pharmacological approaches. We talked about maintaining a healthy lifestyle in general. I encouraged the patient to eat healthy, exercise daily and keep well hydrated, to keep a scheduled bedtime and wake time routine, to not skip any meals and eat healthy snacks in between meals and to have protein with every meal.   I advised the patient about common headache triggers: sleep deprivation, dehydration, overheating,  stress, hypoglycemia or skipping meals and blood sugar fluctuations, excessive pain medications or excessive alcohol use or caffeine withdrawal. Some people have food triggers such as aged cheese, orange juice or chocolate, especially dark chocolate, or MSG (monosodium glutamate). She is to try to avoid these headache triggers as much possible. It may be helpful to keep a headache diary to figure out what makes Her headaches worse or brings them on and what alleviates them. Some people report headache onset after exercise but studies have shown that regular exercise may actually prevent headaches from coming. If She has exercise-induced headaches, She is advised to drink plenty of fluid before and after exercising and that to not overdo it and to not overheat.  As far as further diagnostic testing is concerned,  I suggested the following today: MRI brain w and w/o Gad. We will call with results.    As far as medications are concerned, I recommended the following at this time: trial of fioricet prn. I suggested we can see her back on an as needed basis.  I answered all her questions today and the patient was in agreement with the above outlined plan.  Thank you very much for allowing me to participate in the care of this nice patient. If I can be of any further assistance to you please do not hesitate to call me at [phone].  Sincerely,   Rebecca Foley, MD, PhD

## 2017-05-22 NOTE — Patient Instructions (Addendum)
Please remember, common headache triggers are: sleep deprivation, dehydration, overheating, stress, hypoglycemia or skipping meals and blood sugar fluctuations, excessive pain medications or excessive alcohol use or caffeine withdrawal. Some people have food triggers such as aged cheese, orange juice or chocolate, especially dark chocolate, or MSG (monosodium glutamate). Try to avoid these headache triggers as much possible. It may be helpful to keep a headache diary to figure out what makes your headaches worse or brings them on and what alleviates them. Some people report headache onset after exercise but studies have shown that regular exercise may actually prevent headaches from coming. If you have exercise-induced headaches, please make sure that you drink plenty of fluid before and after exercising and that you do not over do it and do not overheat.  For completion, we will do a brain MRI and call you with the results.   We can try Fioricet for as needed use, however, we will do a one-time prescription with one refill. Please be aware that this is to be used cautiously as it can be addicting and sedating. It is not for daily use.   Do not use BC powder on a daily basis.   Your neurological exam looks good. We can see you back as needed.

## 2017-06-09 ENCOUNTER — Inpatient Hospital Stay: Admission: RE | Admit: 2017-06-09 | Payer: Medicaid Other | Source: Ambulatory Visit

## 2018-05-07 ENCOUNTER — Encounter

## 2018-05-07 ENCOUNTER — Ambulatory Visit: Payer: Medicaid Other | Admitting: Neurology

## 2018-05-07 ENCOUNTER — Telehealth: Payer: Self-pay

## 2018-05-07 NOTE — Telephone Encounter (Signed)
Pt did not show for their appt with Dr. Athar today.  

## 2018-05-08 ENCOUNTER — Encounter: Payer: Self-pay | Admitting: Neurology

## 2018-07-15 ENCOUNTER — Ambulatory Visit: Payer: Medicaid Other | Admitting: Neurology

## 2018-12-28 IMAGING — CR DG CERVICAL SPINE COMPLETE 4+V
6 series · 7 of 7 positions shown · non-contrast
Comparison: None.

CLINICAL DATA: Chronic posterior cervical spine pain following
trampoline injury 2 years ago.

EXAM:
CERVICAL SPINE - COMPLETE 4+ VIEW

[c-spine lat]
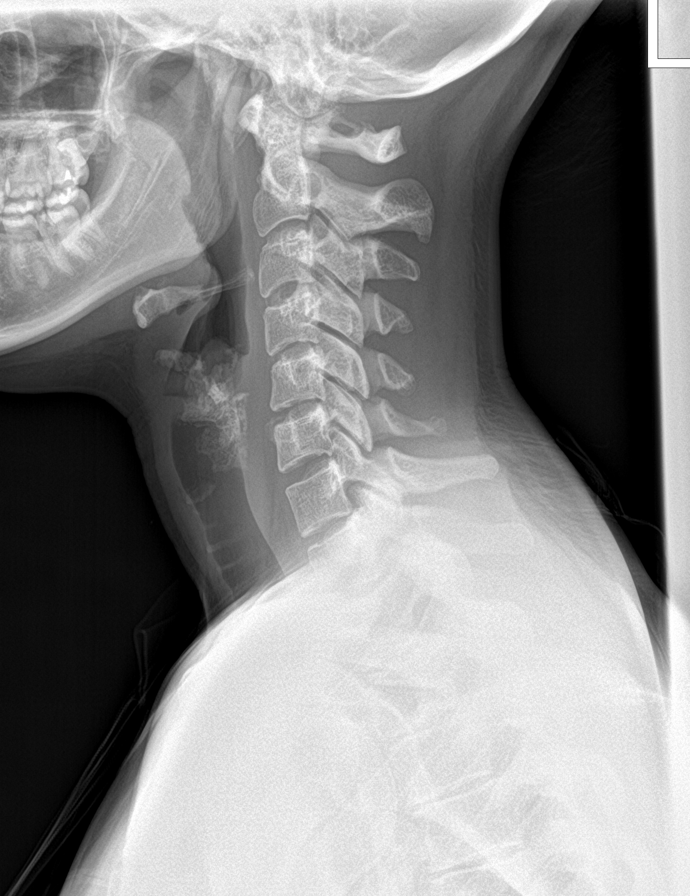

[c-spine obl (1 of 2)]
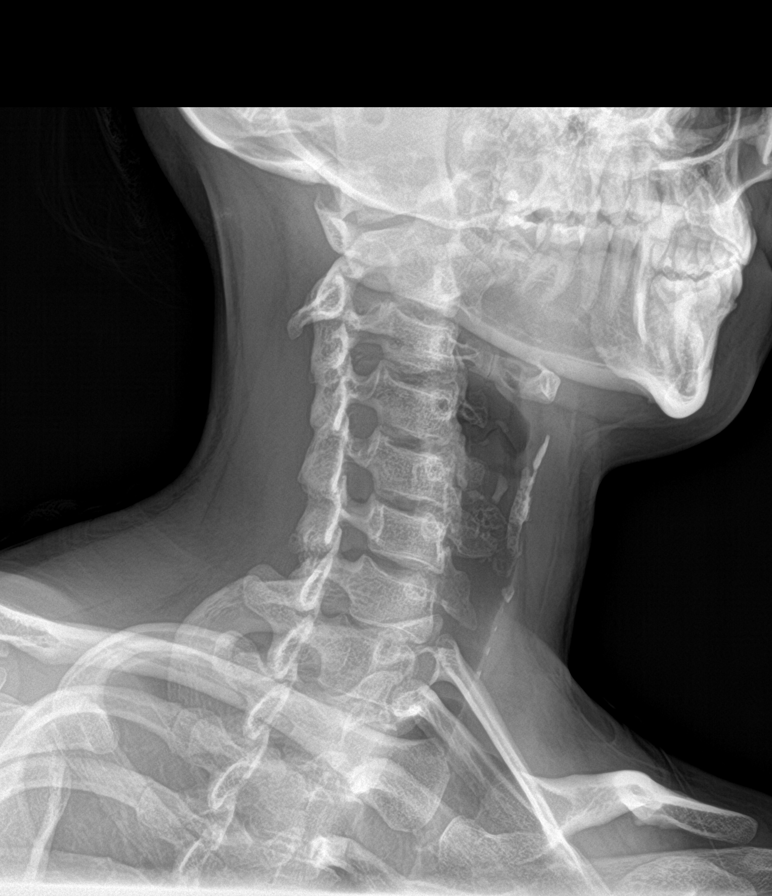

[c-spine obl (2 of 2)]
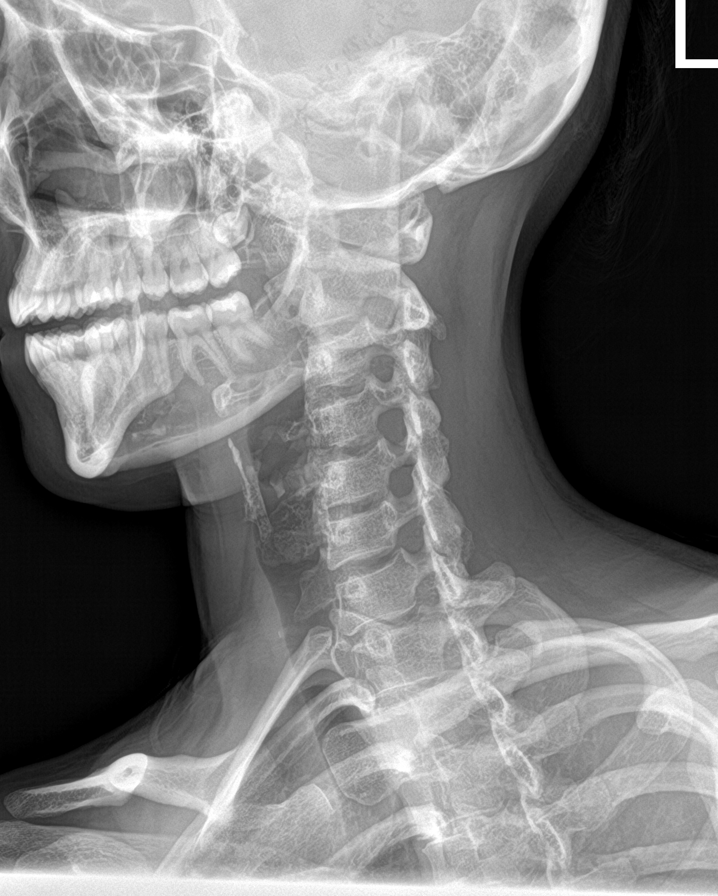

[c-spine ap]
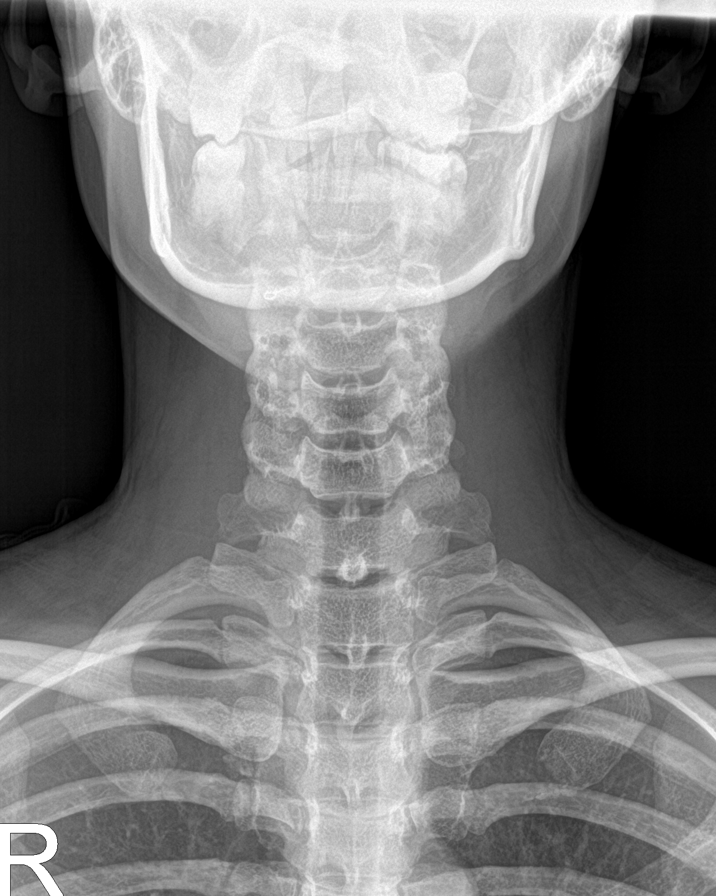

[Series 5: c-spine open mouth · 0.14mm/px · 2 of 2 slices shown]
[im 1/2]
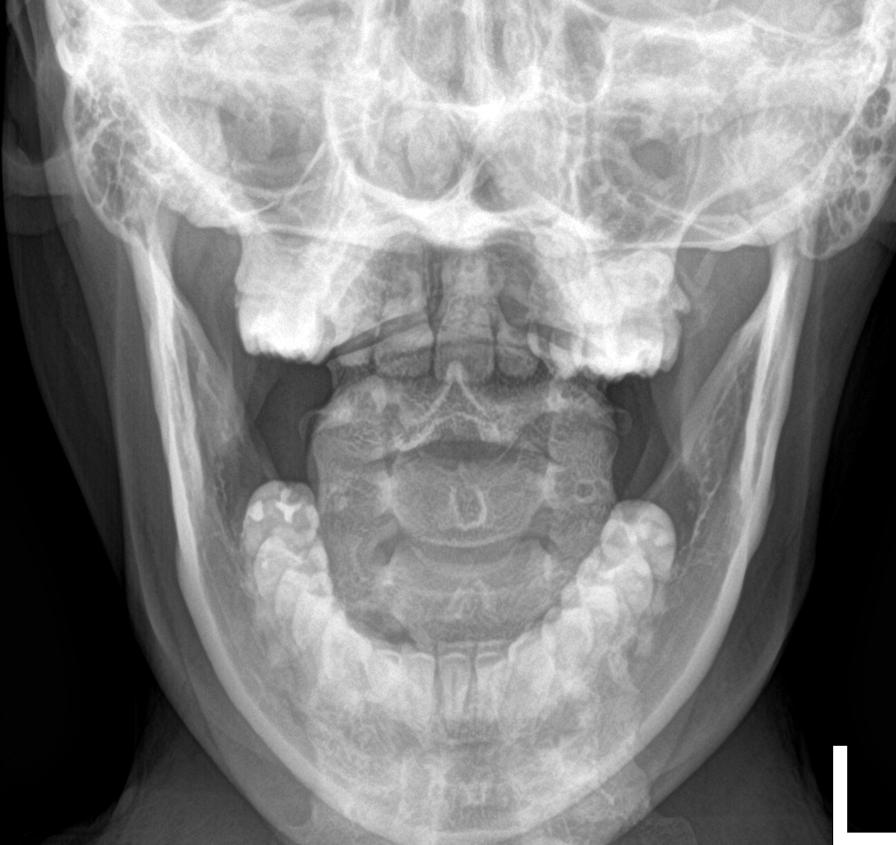
[im 2/2]
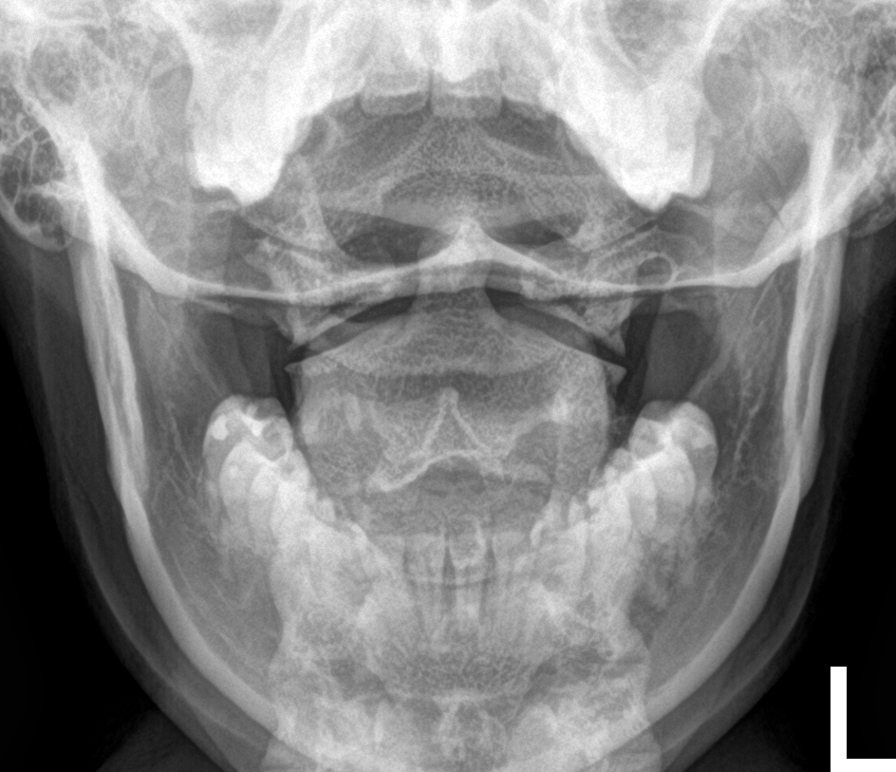

[[person_name]]
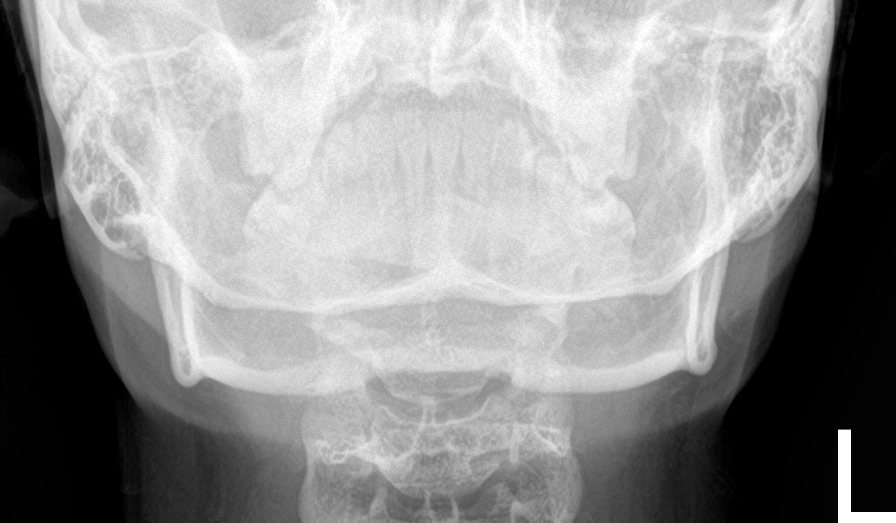

[7 of 7 positions shown; findings below may reference images not displayed]

FINDINGS: There is no evidence of cervical spine fracture or prevertebral soft
tissue swelling. Alignment is normal. No other significant bone
abnormalities are identified.
IMPRESSION: Negative cervical spine radiographs.

## 2018-12-28 IMAGING — CR DG THORACIC SPINE 2V
2 series · 2 of 2 positions shown · non-contrast
Comparison: None.

CLINICAL DATA: Chronic thoracic spine pain following trampoline
injury 2 years ago.

EXAM:
THORACIC SPINE 2 VIEWS

[t-spine ap]
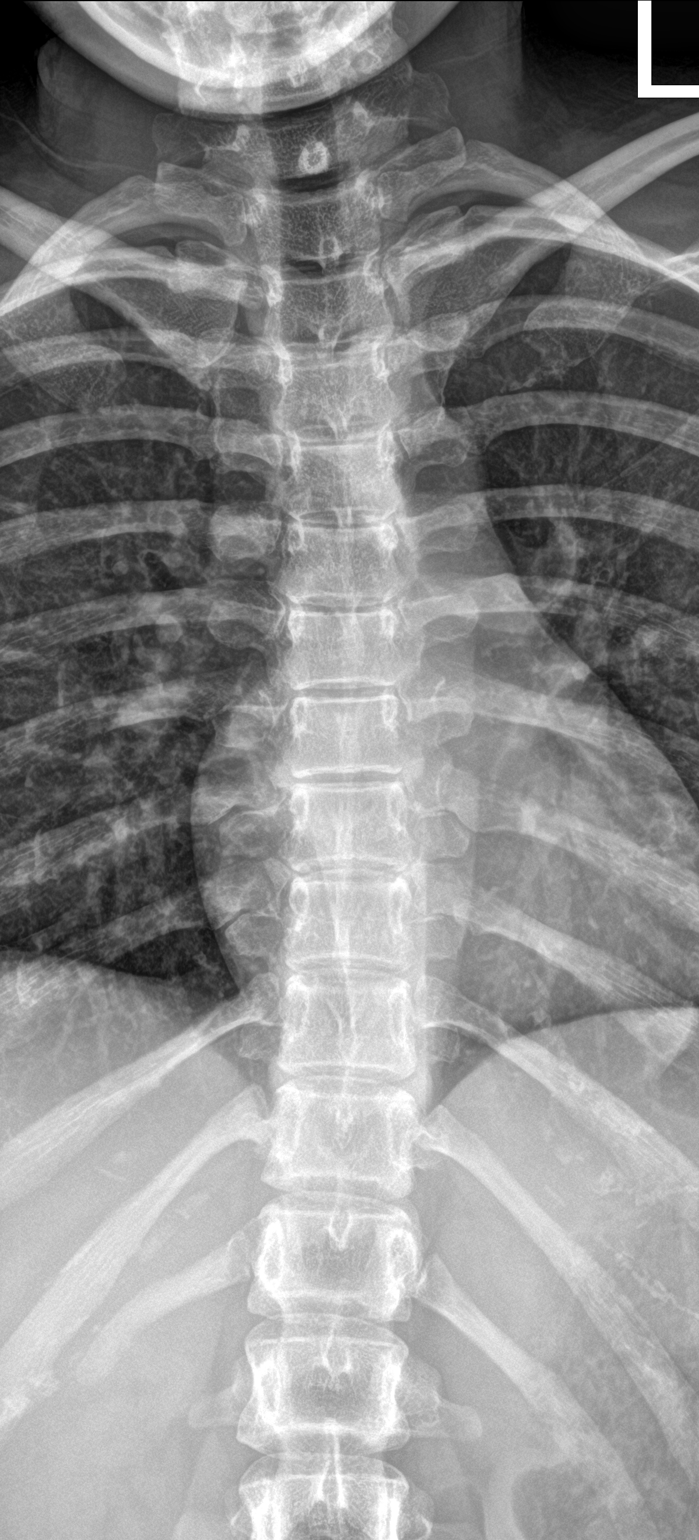

[t-spine lat]
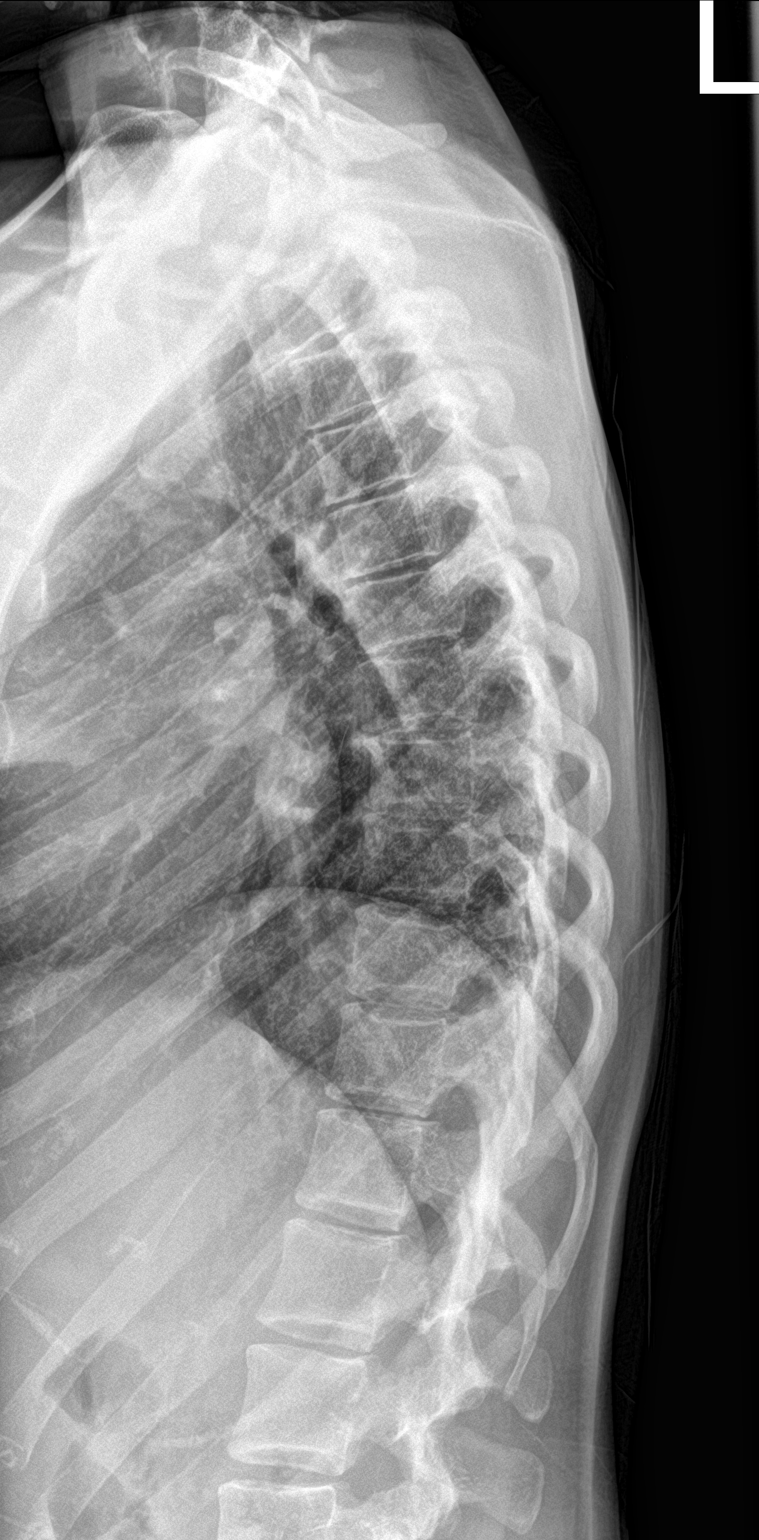

[2 of 2 positions shown; findings below may reference images not displayed]

FINDINGS: There is no evidence of fracture or subluxation.

A mild apex left thoracic scoliosis noted.

The disc spaces are maintained.

No focal bony lesions are identified.
IMPRESSION: Mild old apex left thoracic scoliosis without other significant
abnormality.

## 2019-02-13 ENCOUNTER — Telehealth: Payer: Self-pay | Admitting: Cardiology

## 2019-02-13 NOTE — Telephone Encounter (Signed)
   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 Ladona Ridgel.  Reason for phone call: Elevated heart rate and palpitations  HPI: Patient was in her usual state of health until earlier this morning.  She woke up feeling more fatigued than usual.  Also felt jittery.  She decided to check her Apple watch and noted that her heart rate overnight was as high as the 200s.  Heart rate this morning was in the 150s.  Patient without blood pressure cuff at home.  However she does not feel lightheaded or dizzy.  She denies any syncope/near syncope.  She does note mild palpitations however not as profound as her palpitations when she had SVT in the past.  She denies any known triggers.  She denies any recent intake of stimulants including no caffeine and no alcohol.  She reports regular p.o. intake.  Denies any recent vomiting or diarrhea and no recent viral-like symptoms.  Unfortunately, she does not have any PRN medications for breakthrough symptoms.  She did try some vagal maneuvers at home without any improvement in HR.   Recommendations: We discussed possible evaluation in an ED/urgent care setting however the patient at this time feels fairly okay.  She reports her current heart rate is in the 130s which has improved after eating breakfast.  She will plan to rest at home and to try to increase intake of fluids and avoid any ingestion of stimulants today.  If her heart rate continues to stay elevated and if she has any worsening symptoms she will later go to the ED or an urgent care for evaluation.  F/u: I will route telephone encounter to [facility] Triage for nursing staff to reach out to patient on Monday morning for follow-up.  They can offer a follow-up visit in EP clinic with either Dr. Ladona Ridgel or an EP APP.

## 2019-02-15 NOTE — Telephone Encounter (Signed)
I spoke to the patient this morning who said that she is feeling the same as the weekend.  She is very tired and weak.  The patient went to Urgent Care on Sunday 3/8 afternoon.  They did an EKG and Lab work.  They checked her BP, but never revealed the numbers.  They told her that her HR was elevated and irregular/BP was low. It was recommended to get her an appt with Dr Ladona Ridgel or EP APP.  Please assist, thank you.

## 2019-02-15 NOTE — Telephone Encounter (Signed)
I agree. She needs an appt w/ EP to re-evaluate. This week if possible.

## 2019-02-15 NOTE — Telephone Encounter (Signed)
lpmtcb 3/9 

## 2019-02-15 NOTE — Telephone Encounter (Signed)
Patient return call. ?

## 2019-02-18 ENCOUNTER — Ambulatory Visit (INDEPENDENT_AMBULATORY_CARE_PROVIDER_SITE_OTHER): Payer: Medicaid Other | Admitting: Internal Medicine

## 2019-02-18 ENCOUNTER — Other Ambulatory Visit: Payer: Self-pay

## 2019-02-18 ENCOUNTER — Encounter: Payer: Self-pay | Admitting: Internal Medicine

## 2019-02-18 VITALS — BP 102/66 | HR 90 | Ht 60.0 in | Wt 113.0 lb

## 2019-02-18 DIAGNOSIS — I471 Supraventricular tachycardia: Secondary | ICD-10-CM

## 2019-02-18 NOTE — Patient Instructions (Signed)
Medication Instructions:  No changes If you need a refill on your cardiac medications before your next appointment, please call your pharmacy.   Lab work: none If you have labs (blood work) drawn today and your tests are completely normal, you will receive your results only by: Marland Kitchen MyChart Message (if you have MyChart) OR . A paper copy in the mail If you have any lab test that is abnormal or we need to change your treatment, we will call you to review the results.  Testing/Procedures: none  Follow-Up: Your physician recommends that you schedule a follow-up appointment as needed with Dr. Ladona Ridgel.

## 2019-02-18 NOTE — Progress Notes (Signed)
HPI Rebecca Christensen returns today for evaluation of an elevated HR on her FitBit. She is a pleasant 28 yo woman with both AVRT and AVNRT who underwent catheter ablation of both over 2 years ago. In the interim, she has done well. She [place] the science museum [place] and awoke on Saturday morning feeling poorly with HA and dizziness. She did not have palpitations. She did have some fever and chills which resolved. Her Fitbit quit working a day or two after it read a HR of 200/min. She did not feel syncope.  Allergies  Allergen Reactions  . Lactose Intolerance (Gi) Other (See Comments)    GI upset     Current Outpatient Medications  Medication Sig Dispense Refill  . butalbital-acetaminophen-caffeine (FIORICET, ESGIC) 50-325-40 MG tablet Take 1 tablet by mouth every 6 (six) hours as needed for headache. 10 tablet 1  . Multiple Vitamin (MULTIVITAMIN WITH MINERALS) TABS tablet Take 1 tablet by mouth 3 (three) times a week.    Marland Kitchen NUVARING 0.12-0.015 MG/24HR vaginal ring INSERT 1 RING VAGINALLY AND LEAVE IN PLACE FOR 3 WEEKS, REMOVE FOR 1 WEEK THEN REPEAT  3   No current facility-administered medications for this visit.      Past Medical History:  Diagnosis Date  . Abnormal Pap smear   . Dysrhythmia    hx palpitations had echo 2009 ?SVT  . History of chicken pox   . Kidney calculi   . Kidney stones   . Lactose intolerance    but does consume dairy    ROS:   All systems reviewed and negative except as noted in the HPI.   Past Surgical History:  Procedure Laterality Date  . CESAREAN SECTION  01/28/2012   Procedure: CESAREAN SECTION;  Surgeon: Juluis Mire, MD;  Location: [facility];  Service: Gynecology;  Laterality: N/A;  Primary cesarean section with delivery of baby  boy at 21. Apgars 9/9.  Marland Kitchen COLPOSCOPY  2009  . ELECTROPHYSIOLOGIC STUDY N/A 11/08/2016   Procedure: SVT Ablation;  Surgeon: Marinus Maw, MD;  Location: [facility] INVASIVE CV LAB;  Service: Cardiovascular;  Laterality:  N/A;  . RENAL ARTERY STENT  10/12   Removed after 1 month  . WISDOM TOOTH EXTRACTION       Family History  Problem Relation Age of Onset  . Hyperlipidemia Mother   . Anesthesia problems Neg Hx   . Hypotension Neg Hx   . Malignant hyperthermia Neg Hx   . Pseudochol deficiency Neg Hx      Social History   Socioeconomic History  . Marital status: Single    Spouse name: Not on file  . Number of children: Not on file  . Years of education: Not on file  . Highest education level: Not on file  Occupational History  . Not on file  Social Needs  . Financial resource strain: Not on file  . Food insecurity:    Worry: Not on file    Inability: Not on file  . Transportation needs:    Medical: Not on file    Non-medical: Not on file  Tobacco Use  . Smoking status: Never Smoker  . Smokeless tobacco: Never Used  Substance and Sexual Activity  . Alcohol use: Yes    Alcohol/week: 2.0 - 6.0 standard drinks    Types: 1 - 3 Glasses of wine, 1 - 3 Cans of beer per week    Comment: socially  . Drug use: No  . Sexual  activity: Yes  Lifestyle  . Physical activity:    Days per week: Not on file    Minutes per session: Not on file  . Stress: Not on file  Relationships  . Social connections:    Talks on phone: Not on file    Gets together: Not on file    Attends religious service: Not on file    Active member of club or organization: Not on file    Attends meetings of clubs or organizations: Not on file    Relationship status: Not on file  . Intimate partner violence:    Fear of current or ex partner: Not on file    Emotionally abused: Not on file    Physically abused: Not on file    Forced sexual activity: Not on file  Other Topics Concern  . Not on file  Social History Narrative  . Not on file     BP 102/66   Pulse 90   Ht 5' (1.524 m)   Wt 113 lb (51.3 kg)   SpO2 98%   BMI 22.07 kg/m   Physical Exam:  Well appearing NAD HEENT: Unremarkable Neck:  No JVD, no  thyromegally Lymphatics:  No adenopathy Back:  No CVA tenderness Lungs:  Clear with no wheezes HEART:  Regular rate rhythm, no murmurs, no rubs, no clicks Abd:  soft, positive bowel sounds, no organomegally, no rebound, no guarding Ext:  2 plus pulses, no edema, no cyanosis, no clubbing Skin:  No rashes no nodules Neuro:  CN II through XII intact, motor grossly intact  EKG - nsr   Assess/Plan: 1. Viral illness - the patients' symptoms are strongly suggestive of a viral illness. I have recommended no additional treatment. 2. SVT - her Fit bit suggested she had SVT but she was not symptomatic in terms of palpitations. She has had no symptoms since her SVT ablation. She will undergo watchful waiting. I would not recommend a heart monitor at this point in time.  Rebecca Christensen.D.

## 2020-01-04 DIAGNOSIS — O34219 Maternal care for unspecified type scar from previous cesarean delivery: Secondary | ICD-10-CM | POA: Insufficient documentation

## 2020-10-04 ENCOUNTER — Encounter: Payer: Self-pay | Admitting: *Deleted

## 2020-10-04 ENCOUNTER — Ambulatory Visit: Payer: Medicaid Other | Admitting: *Deleted

## 2020-10-04 ENCOUNTER — Ambulatory Visit: Payer: Medicaid Other | Attending: Obstetrics and Gynecology | Admitting: Obstetrics

## 2020-10-04 ENCOUNTER — Other Ambulatory Visit: Payer: Self-pay

## 2020-10-04 DIAGNOSIS — Z8759 Personal history of other complications of pregnancy, childbirth and the puerperium: Secondary | ICD-10-CM | POA: Insufficient documentation

## 2020-10-04 DIAGNOSIS — I471 Supraventricular tachycardia: Secondary | ICD-10-CM

## 2020-10-04 DIAGNOSIS — Z3169 Encounter for other general counseling and advice on procreation: Secondary | ICD-10-CM

## 2020-10-04 DIAGNOSIS — O09299 Supervision of pregnancy with other poor reproductive or obstetric history, unspecified trimester: Secondary | ICD-10-CM

## 2020-10-04 NOTE — Progress Notes (Signed)
Here today for MD consult-preconception. BP 108/69 Pulse 86 WEIGHT 127LBS

## 2020-10-10 NOTE — Progress Notes (Signed)
MFM Consult note  Rebecca Christensen is a 29 year old gravida 3 para 1 who was seen for preconception consultation due to a stillbirth that occurred at between 68 to 25 weeks in May 2021.  She reports that everything had been going well in that pregnancy except for a lower normal weight that was noted during her prenatal ultrasounds.  At around 24+ weeks during a routine prenatal visit, there was no heartbeat that was detected.  The cause of the fetal demise remains undetermined. She reports that she had a normal fetal anatomy scan, a negative screen for Down syndrome, and that the placental pathology for the stillbirth was within normal limits.  She denied having high blood pressure or diabetes.  The stillbirth occurred while she was receiving care at [facility].    Her past obstetrical history includes a full-term uncomplicated cesarean delivery in 2013 of a live female infant weighing 6 pounds 14 ounces.  She also had a first trimester miscarriage that occurred at around 12 weeks while she still had an IUD in place.  Past medical history includes kidney stones and ablation for supraventricular tachycardia in 2017.  Past surgical history includes the prior C-section and ureteral stent placement in 2012 for management of her kidney stones.  She denies taking any medications currently.  As the patient and her husband are considering conceiving another pregnancy in the near future, she was referred for consultation today to discuss the management of her future pregnancy to help improve her obstetrical outcome. The patient and her husband were reassured that most women who experience an adverse pregnancy outcome in one pregnancy, will go on to have a successful pregnancy outcome in their subsequent pregnancy.  As the patient does not have any underlying medical problems and as she does not have any history of thrombophilias or the antiphospholipid antibody syndrome, I anticipate that she will most likely have a  normal pregnancy outcome with her next pregnancy.  They understand that hopefully with increased maternal and fetal surveillance, we can help her achieve a successful outcome during her next pregnancy.  She was advised that during the preconception period, she should start taking prenatal vitamins with an extra folic acid to decrease her risk of having a fetus with neural tube defects.  She should have a cell free DNA test drawn at between 10 to 12 weeks of her future pregnancy to screen for fetal aneuploidy.  She should have a first trimester ultrasound performed at around 12 weeks to assess the nuchal translucency and to confirm her dates.  She should have a detailed fetal anatomy scan performed at 19 weeks to screen for fetal anomalies.  She should then be followed with growth ultrasounds every 4 to 5 weeks to ensure that the fetal growth is within normal limits.  Weekly fetal testing should be started at around 32 weeks.  Hopefully, with increased surveillance, we will be able to help her achieve a successful pregnancy outcome.  We would be happy to see her for her ultrasound exams during her future pregnancy.  Although controversial as she does not have a history of preeclampsia, she was advised that she may start taking a daily baby aspirin (81 mg) starting at 10 to 12 weeks of her future pregnancy.  She was advised that studies have indicated a daily baby aspirin may prevent preeclampsia and possibly decrease her risk of a preterm birth.  She was reassured that taking a daily baby aspirin throughout her future pregnancy will not harm her  fetus.  At the end of the consultation, the patient and her husband stated that all their questions had been answered to their complete satisfaction.    Thank you for referring this patient for a Maternal-Fetal Medicine preconception consultation.  Total consult time: 40 minutes  This report was copied from an original document generated using [employer].  Recommendations:  Start taking prenatal vitamins with extra folic acid during the preconception period  Once she conceives, she should have:  First trimester ultrasound performed to confirm her dates  Cell free DNA test at around 10 to 12 weeks  Start daily baby aspirin (81 mg daily) at between 10 to 12 weeks  Detailed fetal anatomy scan at around 19 weeks  Serial growth ultrasounds every 4 to 5 weeks  Weekly fetal testing starting at 32 weeks Delivery at around 39 weeks

## 2021-06-01 ENCOUNTER — Inpatient Hospital Stay (HOSPITAL_COMMUNITY)
Admission: AD | Admit: 2021-06-01 | Discharge: 2021-06-01 | Disposition: A | Discharge status: Home / Self Care | Payer: Medicaid Other | Attending: Obstetrics and Gynecology | Admitting: Obstetrics and Gynecology

## 2021-06-01 DIAGNOSIS — Z3202 Encounter for pregnancy test, result negative: Secondary | ICD-10-CM | POA: Insufficient documentation

## 2021-06-01 LAB — POCT PREGNANCY, URINE: Preg Test, Ur: NEGATIVE

## 2021-06-01 NOTE — MAU Note (Signed)
Isn't to start period for a couple more days.  Started bleeding heavily today, passing large (palm size clots).  Going through quite a few pads an hour. Had spotting on Mon/Tue.  Wondered if it was implantation bleeding. Has not done a home test.  Having a weird twitchy muscle thing on LLQ, but is not painful.

## 2021-06-01 NOTE — MAU Provider Note (Signed)
[name] is a 30 y.o. [phone] female who presents to MAU today with complaint of heavy VB and period not due for a few days. Reports spotting a few days ago then started having heavy bleeding with clots today. Not using contraception. Has not taken a pregnancy test.  ROS: +VB  O BP 105/70 (BP Location: Right Arm)   Pulse 72   Temp 98.2 F (36.8 C) (Oral)   Resp 16   Ht 5' (1.524 m)   Wt 50.3 kg   LMP 05/06/2021   SpO2 100%   BMI 21.66 kg/m  Physical Exam Vitals and nursing note reviewed.  Constitutional:      General: She is not in acute distress.    Appearance: Normal appearance.  HENT:     Head: Normocephalic.  Cardiovascular:     Rate and Rhythm: Normal rate.  Pulmonary:     Effort: Pulmonary effort is normal. No respiratory distress.  Musculoskeletal:     Cervical back: Normal range of motion.  Neurological:     General: No focal deficit present.     Mental Status: She is alert and oriented to person, place, and time.  Psychiatric:        Mood and Affect: Mood normal.        Behavior: Behavior normal.   Results for orders placed or performed during the hospital encounter of 06/01/21 (from the past 24 hour(s))  Pregnancy, urine POC     Status: None   Collection Time: 06/01/21  1:06 PM  Result Value Ref Range   Preg Test, Ur NEGATIVE NEGATIVE    MDM: No signs of pregnancy. Offered pt emergent evaluation in ED but declines at this time. Warning signs discussed. Stable for discharge home.   A 1. Negative pregnancy test     P Discharge from MAU in stable condition Warning signs for worsening condition that would warrant emergency follow-up discussed Patient may return to MAU as needed for pregnancy related complaints  Donette Larry, CNM 06/01/2021 1:30 PM

## 2022-07-18 LAB — OB RESULTS CONSOLE ABO/RH: RH Type: POSITIVE

## 2022-07-18 LAB — OB RESULTS CONSOLE GC/CHLAMYDIA
Chlamydia: NEGATIVE
Neisseria Gonorrhea: NEGATIVE

## 2022-07-18 LAB — OB RESULTS CONSOLE HEPATITIS B SURFACE ANTIGEN
Hepatitis B Surface Ag: NEGATIVE
Hepatitis B Surface Ag: NEGATIVE

## 2022-07-18 LAB — OB RESULTS CONSOLE RUBELLA ANTIBODY, IGM: Rubella: IMMUNE

## 2022-07-18 LAB — OB RESULTS CONSOLE HIV ANTIBODY (ROUTINE TESTING): HIV: NONREACTIVE

## 2022-07-18 LAB — OB RESULTS CONSOLE ANTIBODY SCREEN: Antibody Screen: NEGATIVE

## 2022-07-18 LAB — HEPATITIS C ANTIBODY: HCV Ab: NEGATIVE

## 2022-07-18 LAB — OB RESULTS CONSOLE RPR: RPR: NONREACTIVE

## 2022-11-15 ENCOUNTER — Observation Stay (HOSPITAL_COMMUNITY)
Admission: AD | Admit: 2022-11-15 | Discharge: 2022-11-16 | Disposition: A | Discharge status: Home / Self Care | Payer: Medicaid Other | Attending: Obstetrics & Gynecology | Admitting: Obstetrics & Gynecology

## 2022-11-15 ENCOUNTER — Encounter (HOSPITAL_COMMUNITY): Payer: Self-pay | Admitting: Obstetrics & Gynecology

## 2022-11-15 DIAGNOSIS — O4693 Antepartum hemorrhage, unspecified, third trimester: Secondary | ICD-10-CM | POA: Diagnosis present

## 2022-11-15 DIAGNOSIS — Z3A28 28 weeks gestation of pregnancy: Secondary | ICD-10-CM | POA: Insufficient documentation

## 2022-11-15 DIAGNOSIS — O468X3 Other antepartum hemorrhage, third trimester: Principal | ICD-10-CM | POA: Insufficient documentation

## 2022-11-15 NOTE — MAU Note (Signed)
Pt says VB- started at 0830- has happened x2 during preg  [facility]- with Dr Ernestina Penna  today- told to watch Has dark red  smear on underwear Last sex- 2 weeks ago  No UC's

## 2022-11-16 ENCOUNTER — Encounter (HOSPITAL_COMMUNITY): Payer: Self-pay | Admitting: Obstetrics & Gynecology

## 2022-11-16 ENCOUNTER — Other Ambulatory Visit: Payer: Self-pay

## 2022-11-16 ENCOUNTER — Inpatient Hospital Stay (HOSPITAL_BASED_OUTPATIENT_CLINIC_OR_DEPARTMENT_OTHER): Payer: Medicaid Other

## 2022-11-16 DIAGNOSIS — Z3A28 28 weeks gestation of pregnancy: Secondary | ICD-10-CM

## 2022-11-16 DIAGNOSIS — O4693 Antepartum hemorrhage, unspecified, third trimester: Secondary | ICD-10-CM | POA: Diagnosis not present

## 2022-11-16 DIAGNOSIS — O09293 Supervision of pregnancy with other poor reproductive or obstetric history, third trimester: Secondary | ICD-10-CM | POA: Diagnosis not present

## 2022-11-16 DIAGNOSIS — O468X3 Other antepartum hemorrhage, third trimester: Secondary | ICD-10-CM | POA: Diagnosis present

## 2022-11-16 LAB — CBC
HCT: 31.4 % — ABNORMAL LOW (ref 36.0–46.0)
Hemoglobin: 11.1 g/dL — ABNORMAL LOW (ref 12.0–15.0)
MCH: 31.1 pg (ref 26.0–34.0)
MCHC: 35.4 g/dL (ref 30.0–36.0)
MCV: 88 fL (ref 80.0–100.0)
Platelets: 250 10*3/uL (ref 150–400)
RBC: 3.57 MIL/uL — ABNORMAL LOW (ref 3.87–5.11)
RDW: 13.1 % (ref 11.5–15.5)
WBC: 12.1 10*3/uL — ABNORMAL HIGH (ref 4.0–10.5)
nRBC: 0 % (ref 0.0–0.2)

## 2022-11-16 LAB — URINALYSIS, ROUTINE W REFLEX MICROSCOPIC
Bacteria, UA: NONE SEEN
Bilirubin Urine: NEGATIVE
Glucose, UA: NEGATIVE mg/dL
Ketones, ur: NEGATIVE mg/dL
Leukocytes,Ua: NEGATIVE
Nitrite: NEGATIVE
Protein, ur: NEGATIVE mg/dL
Specific Gravity, Urine: 1.004 — ABNORMAL LOW (ref 1.005–1.030)
pH: 7 (ref 5.0–8.0)

## 2022-11-16 LAB — TYPE AND SCREEN
ABO/RH(D): B POS
Antibody Screen: NEGATIVE

## 2022-11-16 LAB — RPR: RPR Ser Ql: NONREACTIVE

## 2022-11-16 MED ORDER — ACETAMINOPHEN 325 MG PO TABS: 650.0000 mg | ORAL_TABLET | ORAL | Status: DC | PRN

## 2022-11-16 MED ORDER — LACTATED RINGERS IV SOLN: 125.0000 mL/h | INTRAVENOUS | Status: DC

## 2022-11-16 MED ORDER — PRENATAL MULTIVITAMIN CH
1.0000 | ORAL_TABLET | Freq: Every day | ORAL | Status: DC
  Filled 2022-11-16: qty 1

## 2022-11-16 MED ORDER — CALCIUM CARBONATE ANTACID 500 MG PO CHEW: 2.0000 | CHEWABLE_TABLET | ORAL | Status: DC | PRN

## 2022-11-16 MED ORDER — DOCUSATE SODIUM 100 MG PO CAPS: 100.0000 mg | ORAL_CAPSULE | Freq: Every day | ORAL | Status: DC

## 2022-11-16 NOTE — H&P (Addendum)
Rebecca Christensen is a 31 y.o. female  [phone] at [redacted]w[redacted]d who presents to MAU with chief complaint of vaginal bleeding.  Reports light bleeding in 1st trim. Then slight spotting-mainly brown during her trip to [place] around 24 weeks. But this was bright red spots. She was previously diagnosed with low lying placenta but is not sure if that has resolved. Patient's bleeding was initially bright red and appeared as smears when she wiped after voiding. She called office on 12/8 and was advised pelvic rest and to monitor and come to MAU if worse since office was closed on 12/9 afternoon. She says during the day yesterday her bleeding became dark red but she visualized small clots "about the size of my thumbnail" that made her come to MAU.  She denies pain/ cramping or any recent intercourse (in several weeks, they are being careful and avoiding sex) She denies DFM, LOF, fever, dysuria/ She is not on anticoagulation or aspirin. Denies cervical LEEP or STIs   Ob care- [facility]/ Dr Ernestina Penna. Good dates by 1st trim sono c/w LMP PNLabs nl  Anatomy sono- posterior low lying placenta, nl CL   ObHx- one C/s (55 yo son) , then VBAC- IOL 25 wks for IUFD   OB History     Gravida  4   Para  2   Term  1   Preterm  1   AB  1   Living  1      SAB  1   IAB  0   Ectopic  0   Multiple  0   Live Births  1          Past Medical History:  Diagnosis Date   Abnormal Pap smear    Dysrhythmia    hx palpitations had echo 2009 ?SVT   History of chicken pox    Kidney calculi    Kidney stones    Lactose intolerance    but does consume dairy   Vaginal Pap smear, abnormal    Past Surgical History:  Procedure Laterality Date   CESAREAN SECTION  01/28/2012   Procedure: CESAREAN SECTION;  Surgeon: Juluis Mire, MD;  Location: [facility] ORS;  Service: Gynecology;  Laterality: N/A;  Primary cesarean section with delivery of baby  boy at 80. Apgars 9/9.   COLPOSCOPY  2009   ELECTROPHYSIOLOGIC STUDY N/A 11/08/2016    Procedure: SVT Ablation;  Surgeon: Marinus Maw, MD;  Location: [facility] INVASIVE CV LAB;  Service: Cardiovascular;  Laterality: N/A;   RENAL ARTERY STENT  10/12   Removed after 1 month   WISDOM TOOTH EXTRACTION     Family History: family history includes Hyperlipidemia in her mother. Social History:  reports that she has never smoked. She has never used smokeless tobacco. She reports current alcohol use of about 2.0 - 6.0 standard drinks of alcohol per week. She reports that she does not use drugs.     Maternal Diabetes: No Genetic Screening: Normal Maternal Ultrasounds/Referrals: Normal Fetal Ultrasounds or other Referrals:  None Maternal Substance Abuse:  No Significant Maternal Medications:  None Significant Maternal Lab Results:  None Number of Prenatal Visits:greater than 3 verified prenatal visits Other Comments:  None  Review of Systems  Respiratory: Negative.    Cardiovascular: Negative.    History   Blood pressure 97/60, pulse 83, temperature 98.4 F (36.9 C), temperature source Oral, resp. rate 18, height 5' (1.524 m), weight 66.3 kg, SpO2 100 %. Exam Physical Exam  Physical exam:  A&O  x 3, no acute distress. Pleasant HEENT neg, no thyromegaly Lungs CTA bilat CV RRR, S1S2 normal Abdo soft, non tender, non acute, relaxed uterus  Extr no edema/ tenderness Pelvic deferred repeating exam. Per MAU CNM- dark blood in vagina, no active bleeding   FHT  140s mod variability + accels no decels  Toco none noted or reported    CBC    Component Value Date/Time   WBC 12.1 (H) 11/16/2022 0228   RBC 3.57 (L) 11/16/2022 0228   HGB 11.1 (L) 11/16/2022 0228   HGB 12.9 04/25/2017 0859   HCT 31.4 (L) 11/16/2022 0228   HCT 39.5 04/25/2017 0859   PLT 250 11/16/2022 0228   PLT 348 04/25/2017 0859   MCV 88.0 11/16/2022 0228   MCV 86 04/25/2017 0859   MCH 31.1 11/16/2022 0228   MCHC 35.4 11/16/2022 0228   RDW 13.1 11/16/2022 0228   RDW 13.3 04/25/2017 0859   LYMPHSABS 1.7  04/25/2017 0859   MONOABS 738 10/25/2016 1555   EOSABS 0.1 04/25/2017 0859   BASOSABS 0.0 04/25/2017 0859      Prenatal labs: ABO, Rh: --/--/B POS (12/09 0228) Antibody: NEG (12/09 0228) Rubella:  imm RPR: NON REACTIVE (12/09 0228)  HBsAg:   neg HIV:   neg GBS:   NA   Limited sono from MAU (MFM read pending) No previa or low placenta noted. CL 3.1 cm, closed. (TV was not performed due to bleeding)  Active FMs.    Assessment/Plan: 31 yo G3P1101 28.2 wks with mild vaginal bleeding. Due to her h/o prior IUFD at 25 wks, she was admitted overnight for observation FHT reactive, okay NST q shift Acontractile No further bleeding so far Pt requesting discharge to attend her son's 1st basketball game. Will reassess by noon, if no bleeding at all and reactive NST, will discharge home and f/up in 2-3 days in office with Dr Ernestina Penna for another sono/   Robley Fries 11/16/2022, 10:45 AM

## 2022-11-16 NOTE — Discharge Summary (Signed)
Physician Discharge Summary  Patient ID: Rebecca Christensen MRN: [id number] DOB/AGE: 08-28-1991 31 y.o.  Admit date: 11/15/2022 Discharge date: 11/16/2022  Admission Diagnoses:  Discharge Diagnoses:  Principal Problem:   Vaginal bleeding in pregnancy, third trimester Active Problems:   Third trimester bleeding, antepartum   Discharged Condition: {condition:18240}  Hospital Course: ***  Consults: {consultation:18241}  Significant Diagnostic Studies: {diagnostics:18242}  Treatments: {Tx:18249}  Discharge Exam: Blood pressure (!) 95/58, pulse 86, temperature 98.5 F (36.9 C), temperature source Oral, resp. rate 16, height 5' (1.524 m), weight 66.3 kg, SpO2 100 %. {physical ZSWF:0932355}  Disposition: Discharge disposition: 01-Home or Self Care       Discharge Instructions     Call MD for:   Complete by: As directed    Keep in mind fetal movements off and on through the day   Diet - low sodium heart healthy   Complete by: As directed    Driving Restrictions   Complete by: As directed    Ok to drive as long as no more bleeding noted   Increase activity slowly   Complete by: As directed    Lifting restrictions   Complete by: As directed    Limit to no more than 10 pounds until further advised   Sexual Activity Restrictions   Complete by: As directed    Until further advised to not have sex      Allergies as of 11/16/2022       Reactions   Lactose Intolerance (gi) Other (See Comments)   GI upset        Medication List     TAKE these medications    aspirin 81 MG chewable tablet Chew by mouth daily.   multivitamin with minerals Tabs tablet Take 1 tablet by mouth 3 (three) times a week.        Follow-up Information     Noland Fordyce, MD Follow up.   Specialty: Obstetrics and Gynecology Why: 2-3 days. Call office on Monday to schedule visit and ultrasound with Dr Ernestina Penna the week of 11/18/22 Contact information: [address] [place]  [address] [phone]                 Signed: Robley Fries 11/16/2022, 1:49 PM

## 2022-11-16 NOTE — MAU Provider Note (Signed)
History     CSN: [id number]  Arrival date and time: 11/15/22 2326   Event Date/Time   First Provider Initiated Contact with Patient 11/16/22 0035      Chief Complaint  Patient presents with   Vaginal Bleeding   HPI Rebecca Christensen is a 31 y.o. [id number] at [redacted]w[redacted]d who presents to MAU with chief complaint of vaginal bleeding. Patient states this is her third episode of bleeding in her current pregnancy. She was previously diagnosed with low lying placenta but is not sure if that has resolved. Patient's bleeding was initially bright red and appeared as smears when she wiped after voiding. She was evaluated in the office this morning and told to present to MAU if her bleeding continued. During the day today her bleeding became dark red and she visualized small clots "about the size of my thumbnail". She denies pain and has not experienced pain at any time during the day. She is remote from sexual intercourse. She denies DFM, LOF, fever, dysuria.  Patient receives care with [facility].  OB History     Gravida  4   Para  2   Term  1   Preterm  1   AB  1   Living  1      SAB  1   IAB  0   Ectopic  0   Multiple  0   Live Births  1           Past Medical History:  Diagnosis Date   Abnormal Pap smear    Dysrhythmia    hx palpitations had echo 2009 ?SVT   History of chicken pox    Kidney calculi    Kidney stones    Lactose intolerance    but does consume dairy   Vaginal Pap smear, abnormal     Past Surgical History:  Procedure Laterality Date   CESAREAN SECTION  01/28/2012   Procedure: CESAREAN SECTION;  Surgeon: Juluis Mire, MD;  Location: [facility];  Service: Gynecology;  Laterality: N/A;  Primary cesarean section with delivery of baby  boy at 10. Apgars 9/9.   COLPOSCOPY  2009   ELECTROPHYSIOLOGIC STUDY N/A 11/08/2016   Procedure: SVT Ablation;  Surgeon: Marinus Maw, MD;  Location: [facility] INVASIVE CV LAB;  Service: Cardiovascular;  Laterality: N/A;   RENAL  ARTERY STENT  10/12   Removed after 1 month   WISDOM TOOTH EXTRACTION      Family History  Problem Relation Age of Onset   Hyperlipidemia Mother    Anesthesia problems Neg Hx    Hypotension Neg Hx    Malignant hyperthermia Neg Hx    Pseudochol deficiency Neg Hx     Social History   Tobacco Use   Smoking status: Never   Smokeless tobacco: Never  Substance Use Topics   Alcohol use: Yes    Alcohol/week: 2.0 - 6.0 standard drinks of alcohol    Types: 1 - 3 Glasses of wine, 1 - 3 Cans of beer per week    Comment: socially   Drug use: No    Allergies:  Allergies  Allergen Reactions   Lactose Intolerance (Gi) Other (See Comments)    GI upset    Medications Prior to Admission  Medication Sig Dispense Refill Last Dose   aspirin 81 MG chewable tablet Chew by mouth daily.   11/15/2022   Multiple Vitamin (MULTIVITAMIN WITH MINERALS) TABS tablet Take 1 tablet by mouth 3 (three) times a week.  11/15/2022    Review of Systems  Genitourinary:  Positive for vaginal bleeding.  All other systems reviewed and are negative.  Physical Exam   Blood pressure 103/66, pulse 94, temperature 98 F (36.7 C), temperature source Oral, resp. rate 18, height 5' (1.524 m), weight 66.3 kg.  Physical Exam Vitals and nursing note reviewed. Exam conducted with a chaperone present.  Constitutional:      Appearance: Normal appearance. She is not ill-appearing.  Cardiovascular:     Rate and Rhythm: Normal rate.     Pulses: Normal pulses.  Pulmonary:     Effort: Pulmonary effort is normal.  Abdominal:     Tenderness: There is no abdominal tenderness.     Comments: Gravid  Genitourinary:    Comments: Pelvic exam: External genitalia normal, vaginal walls pink and well rugated, cervix visually closed, no lesions noted. Small amount of dark red blood oozing from cervical os.    Neurological:     Mental Status: She is alert.  Psychiatric:        Mood and Affect: Mood normal.        Behavior:  Behavior normal.        Thought Content: Thought content normal.        Judgment: Judgment normal.     MAU Course  Procedures  MDM  --Reactive tracing: baseline 145, mod var, + accels, no decels --Toco: UI --EMR reviewed. Prenatal records in [place]. Posterior placenta documented as of 11/04/2022. --Hx 26 week IUFD --Care coordinated with Dr. Shawnie Pons   Orders Placed This Encounter  Procedures   [place] MFM OB LIMITED   Urinalysis, Routine w reflex microscopic Urine, Clean Catch   Patient Vitals for the past 24 hrs:  BP Temp Temp src Pulse Resp Height Weight  11/15/22 2354 103/66 98 F (36.7 C) Oral 94 18 5' (1.524 m) 66.3 kg   Assessment and Plan  --32 y.o. [id number] at [redacted]w[redacted]d  --Bleeding in third trimester --Reactive tracing --No acute findings on MFM ultrasound --Blood type B Positive --Per Dr. Juliene Pina, place in observation on OBSC  Calvert Cantor, [place], MSN, CNM 11/16/2022, 1:58 AM

## 2022-11-16 NOTE — Progress Notes (Signed)
Spoke w/ RN. Pt reports slight brown streak. No active bleeding. No UCs. Good FMs. No LOF BP (!) 95/58 (BP Location: Right Arm)   Pulse 86   Temp 98.5 F (36.9 C) (Oral)   Resp 16   Ht 5' (1.524 m)   Wt 66.3 kg   SpO2 100%   BMI 28.53 kg/m   NST 12.00 noon 140-150s mod variability + accels no decels - reactive  Toco irritability, not aware per pt   Ok discharge home per pt request and considering bleeding was not heavy to begin with but admitted for observation due to poor ob hx.   Pelvic rest, strict precautions, warning ss dw pt  F/up this week w/ Dr Ernestina Penna and will add sono w/ growth and placenta this week

## 2022-11-16 NOTE — Progress Notes (Signed)
Report given to [facility] charge nurse.

## 2022-11-23 ENCOUNTER — Inpatient Hospital Stay (HOSPITAL_COMMUNITY)
Admission: AD | Admit: 2022-11-23 | Discharge: 2022-11-23 | Disposition: A | Discharge status: Home / Self Care | Payer: Medicaid Other | Attending: Obstetrics | Admitting: Obstetrics

## 2022-11-23 ENCOUNTER — Encounter (HOSPITAL_COMMUNITY): Payer: Self-pay | Admitting: Obstetrics

## 2022-11-23 DIAGNOSIS — Z3A29 29 weeks gestation of pregnancy: Secondary | ICD-10-CM

## 2022-11-23 DIAGNOSIS — N200 Calculus of kidney: Secondary | ICD-10-CM | POA: Insufficient documentation

## 2022-11-23 DIAGNOSIS — O26833 Pregnancy related renal disease, third trimester: Secondary | ICD-10-CM | POA: Insufficient documentation

## 2022-11-23 DIAGNOSIS — O26893 Other specified pregnancy related conditions, third trimester: Secondary | ICD-10-CM | POA: Diagnosis present

## 2022-11-23 LAB — URINALYSIS, ROUTINE W REFLEX MICROSCOPIC
Bilirubin Urine: NEGATIVE
Glucose, UA: NEGATIVE mg/dL
Ketones, ur: NEGATIVE mg/dL
Nitrite: NEGATIVE
Protein, ur: NEGATIVE mg/dL
Specific Gravity, Urine: 1.012 (ref 1.005–1.030)
pH: 7 (ref 5.0–8.0)

## 2022-11-23 LAB — OB RESULTS CONSOLE GBS: GBS: NEGATIVE

## 2022-11-23 NOTE — MAU Note (Signed)
Patient reports "feeling like she passed her kidney stone" when giving UA sample in MAU. Patient denies pain at this time.

## 2022-11-23 NOTE — MAU Provider Note (Signed)
History     [id number]  Arrival date and time: 11/23/22 1529    Chief Complaint  Patient presents with   Urinary Tract Infection   Flank Pain     HPI Rebecca Christensen is a 31 y.o. at [redacted]w[redacted]d who presents for flank pain. Symptoms started this morning. Has history of kidney stones. States she woke up feeling like she had a stone on the right side due to throbbing right flank pain. Also had some bladder spasm pain & dysuria. While in the bathroom of MAU she feels like she passed the stone. Since then has no pain or symptoms.  Denies fever, n/v, hematuria, vaginal bleeding, LOF, or abdominal pain. Sees her OB this week. Good fetal movement.   OB History     Gravida  4   Para  2   Term  1   Preterm  1   AB  1   Living  1      SAB  1   IAB  0   Ectopic  0   Multiple  0   Live Births  1           Past Medical History:  Diagnosis Date   Abnormal Pap smear    Dysrhythmia    hx palpitations had echo 2009 ?SVT   History of chicken pox    Kidney calculi    Kidney stones    Lactose intolerance    but does consume dairy   Vaginal Pap smear, abnormal     Past Surgical History:  Procedure Laterality Date   CESAREAN SECTION  01/28/2012   Procedure: CESAREAN SECTION;  Surgeon: Juluis Mire, MD;  Location: [facility];  Service: Gynecology;  Laterality: N/A;  Primary cesarean section with delivery of baby  boy at 7. Apgars 9/9.   COLPOSCOPY  2009   ELECTROPHYSIOLOGIC STUDY N/A 11/08/2016   Procedure: SVT Ablation;  Surgeon: Marinus Maw, MD;  Location: [facility] INVASIVE CV LAB;  Service: Cardiovascular;  Laterality: N/A;   RENAL ARTERY STENT  10/12   Removed after 1 month   WISDOM TOOTH EXTRACTION      Family History  Problem Relation Age of Onset   Hyperlipidemia Mother    Anesthesia problems Neg Hx    Hypotension Neg Hx    Malignant hyperthermia Neg Hx    Pseudochol deficiency Neg Hx     Allergies  Allergen Reactions   Lactose Intolerance (Gi) Other  (See Comments)    GI upset    No current facility-administered medications on file prior to encounter.   Current Outpatient Medications on File Prior to Encounter  Medication Sig Dispense Refill   aspirin 81 MG chewable tablet Chew by mouth daily.     folic acid (FOLVITE) 1 MG tablet Take 2 tablets by mouth daily.     glycopyrrolate (ROBINUL) 1 MG tablet Take 1 tablet by mouth 2 (two) times daily.     Multiple Vitamin (MULTIVITAMIN WITH MINERALS) TABS tablet Take 1 tablet by mouth 3 (three) times a week.       ROS Pertinent positives and negative per HPI, all others reviewed and negative  Physical Exam   BP 105/62   Pulse 97   Temp 98.3 F (36.8 C) (Oral)   Resp 18   Ht 5' (1.524 m)   Wt 65.5 kg   SpO2 100%   BMI 28.22 kg/m   Patient Vitals for the past 24 hrs:  BP Temp Temp src Pulse Resp SpO2  Height Weight  11/23/22 1641 -- -- -- -- -- 100 % -- --  11/23/22 1548 105/62 98.3 F (36.8 C) Oral 97 18 -- 5' (1.524 m) 65.5 kg    Physical Exam Vitals and nursing note reviewed.  Constitutional:      General: She is not in acute distress.    Appearance: Normal appearance. She is not ill-appearing.  HENT:     Head: Normocephalic and atraumatic.  Eyes:     General: No scleral icterus.    Conjunctiva/sclera: Conjunctivae normal.  Pulmonary:     Effort: Pulmonary effort is normal. No respiratory distress.  Abdominal:     Tenderness: There is no right CVA tenderness or left CVA tenderness.  Skin:    General: Skin is warm and dry.  Neurological:     Mental Status: She is alert.  Psychiatric:        Mood and Affect: Mood normal.        Behavior: Behavior normal.       FHT Baseline 145, moderate variability, 15x15 accels, no decels Toco: none Cat: 1  Labs Results for orders placed or performed during the hospital encounter of 11/23/22 (from the past 24 hour(s))  Urinalysis, Routine w reflex microscopic Urine, Clean Catch     Status: Abnormal   Collection Time:  11/23/22  3:41 PM  Result Value Ref Range   Color, Urine YELLOW YELLOW   APPearance HAZY (A) CLEAR   Specific Gravity, Urine 1.012 1.005 - 1.030   pH 7.0 5.0 - 8.0   Glucose, UA NEGATIVE NEGATIVE mg/dL   Hgb urine dipstick MODERATE (A) NEGATIVE   Bilirubin Urine NEGATIVE NEGATIVE   Ketones, ur NEGATIVE NEGATIVE mg/dL   Protein, ur NEGATIVE NEGATIVE mg/dL   Nitrite NEGATIVE NEGATIVE   Leukocytes,Ua TRACE (A) NEGATIVE   RBC / HPF 21-50 0 - 5 RBC/hpf   WBC, UA 0-5 0 - 5 WBC/hpf   Bacteria, UA RARE (A) NONE SEEN   Squamous Epithelial / LPF 11-20 0 - 5   Mucus PRESENT    Amorphous Crystal PRESENT     Imaging No results found.  MAU Course  Procedures Lab Orders         Culture, OB Urine         Urinalysis, Routine w reflex microscopic Urine, Clean Catch    No orders of the defined types were placed in this encounter.  Imaging Orders  No imaging studies ordered today    MDM Reactive NST  Patient with history of recurrent kidney stones. Believes she passed the stone when she arrive to MAU & denies any further symptoms.  No CVA tenderness & afebrile.  U/a not obvious of infection. Bladder spasm likely related to passage of stone but will send urine for culture.  Assessment and Plan   1. Pregnancy with nephrolithiasis in third trimester   2. [redacted] weeks gestation of pregnancy    -Reviewed pyelo/stone precautions & reasons to return to MAU -Urine culture pending -Keep f/u with OB  Judeth Horn, NP 11/23/22 5:09 PM

## 2022-11-23 NOTE — MAU Note (Signed)
.  Rebecca Christensen is a 31 y.o. at [redacted]w[redacted]d here in MAU reporting: woke  up this morning with UTI symptoms. Pain and burning and frequency. Stated she has also had right flank pain off and on today as well. Has history of kidney stones with las t pregnancy so sh want to make sure that is not happening again. No fever or chills. Drank a lot of cranberry juice and pain got better.  LMP: Onset of complaint: this morning Pain score: 1 There were no vitals filed for this visit.   FHT:155 Lab orders placed from triage:   U/A

## 2022-11-24 LAB — CULTURE, OB URINE
Culture: <10000 — AB
Special Requests: NORMAL

## 2022-12-17 ENCOUNTER — Other Ambulatory Visit: Payer: Self-pay | Admitting: Obstetrics

## 2023-01-07 NOTE — Patient Instructions (Signed)
Johanny Segers Geffre  01/07/2023   Your procedure is scheduled on:  01/22/2023  Arrive at [place] at [employer] [facility] on [address] at [facility]  and [address]. You are invited to use the FREE valet parking or use the Visitor's parking deck.  Pick up the phone at the desk and dial [phone].  Call this number if you have problems the morning of surgery: [phone]  Remember:   Do not eat food:(After Midnight) Desps de medianoche.  Do not drink clear liquids: (After Midnight) Desps de medianoche.  Take these medicines the morning of surgery with A SIP OF WATER:  none   Do not wear jewelry, make-up or nail polish.  Do not wear lotions, powders, or perfumes. Do not wear deodorant.  Do not shave 48 hours prior to surgery.  Do not bring valuables to the hospital.  [facility] is not   responsible for any belongings or valuables brought to the hospital.  Contacts, dentures or bridgework may not be worn into surgery.  Leave suitcase in the car. After surgery it may be brought to your room.  For patients admitted to the hospital, checkout time is 11:00 AM the day of              discharge.      Please read over the following fact sheets that you were given:     Preparing for Surgery

## 2023-01-08 ENCOUNTER — Encounter (HOSPITAL_COMMUNITY): Payer: Self-pay

## 2023-01-13 ENCOUNTER — Other Ambulatory Visit: Payer: Self-pay

## 2023-01-13 ENCOUNTER — Encounter (HOSPITAL_COMMUNITY): Payer: Self-pay | Admitting: Obstetrics and Gynecology

## 2023-01-13 ENCOUNTER — Inpatient Hospital Stay (HOSPITAL_COMMUNITY)
Admission: AD | Admit: 2023-01-13 | Discharge: 2023-01-13 | Disposition: A | Discharge status: Home / Self Care | Payer: Medicaid Other | Attending: Obstetrics and Gynecology | Admitting: Obstetrics and Gynecology

## 2023-01-13 DIAGNOSIS — M7918 Myalgia, other site: Secondary | ICD-10-CM | POA: Insufficient documentation

## 2023-01-13 DIAGNOSIS — Z3A36 36 weeks gestation of pregnancy: Secondary | ICD-10-CM | POA: Diagnosis not present

## 2023-01-13 DIAGNOSIS — O26893 Other specified pregnancy related conditions, third trimester: Secondary | ICD-10-CM | POA: Diagnosis not present

## 2023-01-13 DIAGNOSIS — Z711 Person with feared health complaint in whom no diagnosis is made: Secondary | ICD-10-CM

## 2023-01-13 DIAGNOSIS — Z3A32 32 weeks gestation of pregnancy: Secondary | ICD-10-CM | POA: Diagnosis not present

## 2023-01-13 LAB — URINALYSIS, ROUTINE W REFLEX MICROSCOPIC
Bilirubin Urine: NEGATIVE
Glucose, UA: NEGATIVE mg/dL
Hgb urine dipstick: NEGATIVE
Ketones, ur: NEGATIVE mg/dL
Leukocytes,Ua: NEGATIVE
Nitrite: NEGATIVE
Protein, ur: NEGATIVE mg/dL
Specific Gravity, Urine: 1.003 — ABNORMAL LOW (ref 1.005–1.030)
pH: 7 (ref 5.0–8.0)

## 2023-01-13 MED ORDER — CYCLOBENZAPRINE HCL 5 MG PO TABS
10.0000 mg | ORAL_TABLET | Freq: Once | ORAL | Status: DC
  Filled 2023-01-13: qty 2

## 2023-01-13 NOTE — MAU Note (Addendum)
.  Rebecca Christensen is a 32 y.o. at [redacted]w[redacted]d here in MAU reporting: yesterday "I coughed" and "something popped in my ribs" - has had sharp rib pain on left side ever since - eased up some, but worse this evening and hurts worse with movement, breathing in, and baby position. Was seen in office today and mentioned it, but no concern. States she has not taken any medication for pain. Denies ctx, VB, or LOF. +FM   Onset of complaint: yesterday  Pain score: 6 Vitals:   01/13/23 2019  BP: 103/68  Pulse: (!) 101  Resp: 19  Temp: 98.4 F (36.9 C)  SpO2: 99%     FHT:150 Lab orders placed from triage:  UA

## 2023-01-13 NOTE — MAU Provider Note (Signed)
History     CSN: [id number]  Arrival date and time: 01/13/23 2000   None     Chief Complaint  Patient presents with   Rib Injury   Rebecca Christensen , a  32 y.o. [id number] at [redacted]w[redacted]d presents to MAU with complaints of right sided rib pain. Patient reports that she coughed last night and states she has had "sharp stabby" pain on that side every since. She reports that the baby was also laying on the side and was making the pain worse. She noted that she attempted hot water in the shower and kinesiology tape without relief. She reports that today the baby "may have kicked" and she was in "excruciating pain that brought her to tears" earlier today. She states that lasted about 20 mins. Currently rating pain a 5/10. She states the only thing that helps is applying pressure and movement like deep breathing and twisting and turning in the opposite direction makes it worse. She denies vaginal bleeding, leaking of fluid and endorses positive fetal movement.           OB History     Gravida  4   Para  2   Term  1   Preterm  1   AB  1   Living  1      SAB  1   IAB  0   Ectopic  0   Multiple  0   Live Births  1           Past Medical History:  Diagnosis Date   Abnormal Pap smear    Dysrhythmia    hx palpitations had echo 2009 ?SVT   History of chicken pox    Kidney calculi    Kidney stones    Lactose intolerance    but does consume dairy   Vaginal Pap smear, abnormal     Past Surgical History:  Procedure Laterality Date   CESAREAN SECTION  01/28/2012   Procedure: CESAREAN SECTION;  Surgeon: Darlyn Chamber, MD;  Location: [facility];  Service: Gynecology;  Laterality: N/A;  Primary cesarean section with delivery of baby  boy at 76. Apgars 9/9.   COLPOSCOPY  2009   ELECTROPHYSIOLOGIC STUDY N/A 11/08/2016   Procedure: SVT Ablation;  Surgeon: Evans Lance, MD;  Location: [place] [facility];  Service: Cardiovascular;  Laterality: N/A;   RENAL ARTERY STENT  10/12    Removed after 1 month   WISDOM TOOTH EXTRACTION      Family History  Problem Relation Age of Onset   Hyperlipidemia Mother    Anesthesia problems Neg Hx    Hypotension Neg Hx    Malignant hyperthermia Neg Hx    Pseudochol deficiency Neg Hx     Social History   Tobacco Use   Smoking status: Never   Smokeless tobacco: Never  Vaping Use   Vaping Use: Never used  Substance Use Topics   Alcohol use: Not Currently    Alcohol/week: 2.0 - 6.0 standard drinks of alcohol    Types: 1 - 3 Glasses of wine, 1 - 3 Cans of beer per week    Comment: socially   Drug use: No    Allergies:  Allergies  Allergen Reactions   Lactose Intolerance (Gi) Other (See Comments)    GI upset    Medications Prior to Admission  Medication Sig Dispense Refill Last Dose   aspirin 81 MG chewable tablet Chew by mouth daily.   01/13/2023   Multiple Vitamin (  MULTIVITAMIN WITH MINERALS) TABS tablet Take 1 tablet by mouth 3 (three) times a week.   08/12/8545   folic acid (FOLVITE) 1 MG tablet Take 2 tablets by mouth daily.      glycopyrrolate (ROBINUL) 1 MG tablet Take 1 tablet by mouth 2 (two) times daily.       Review of Systems  Constitutional:  Negative for chills, fatigue and fever.  Eyes:  Negative for pain and visual disturbance.  Respiratory:  Negative for apnea, shortness of breath and wheezing.   Cardiovascular:  Negative for chest pain and palpitations.  Gastrointestinal:  Positive for abdominal pain. Negative for constipation, diarrhea, nausea and vomiting.  Genitourinary:  Negative for difficulty urinating, dysuria, pelvic pain, vaginal bleeding, vaginal discharge and vaginal pain.  Musculoskeletal:  Negative for back pain.  Neurological:  Negative for seizures, weakness and headaches.  Psychiatric/Behavioral:  Negative for suicidal ideas.    Physical Exam   Blood pressure 103/68, pulse (!) 101, temperature 98.4 F (36.9 C), temperature source Oral, resp. rate 19, height 5' (1.524 m),  weight 68.9 kg, SpO2 99 %.  Physical Exam Vitals and nursing note reviewed.  Constitutional:      General: She is not in acute distress.    Appearance: Normal appearance.  HENT:     Head: Normocephalic.  Cardiovascular:     Rate and Rhythm: Normal rate and regular rhythm.  Pulmonary:     Effort: Pulmonary effort is normal.  Abdominal:     Palpations: Abdomen is soft.     Tenderness: There is abdominal tenderness. There is guarding.     Comments: Patient winces with deep palpation to the left lateral aspect of the upper Quadrant.   Musculoskeletal:        General: Tenderness present. Normal range of motion.     Cervical back: Normal range of motion.  Skin:    General: Skin is warm and dry.     Capillary Refill: Capillary refill takes less than 2 seconds.  Neurological:     Mental Status: She is alert and oriented to person, place, and time.  Psychiatric:        Mood and Affect: Mood normal.    FHT: 150bpm with moderate variability and 15x15 accels. No decels.  Toco: UI and occasional contraction. (Patient unaware)     MAU Course  Procedures Orders Placed This Encounter  Procedures   Urinalysis, Routine w reflex microscopic -Urine, Clean Catch   Discharge patient    MDM - Pain consistent with a pulled muscle.  - Offered pharmacologic pain relief via muscle relaxer or Tylenol and paint declined both options.  - Discussed the low likelihood of a broken bone and X-ray, to rule out typically not recommended in pregnancy.  - Reccommended Rest, relaxation and the alternation of Ice and Heat for comfort.  - Plan for discharge.   Assessment and Plan   1. Musculoskeletal pain   2. [redacted] weeks gestation of pregnancy   3. Physically well but worried    - Discussed pain is most likely related to a pulled or strained muscle.  - Recommended Rest and Ice/Heat for comfort.  - Worsening signs and return precautions reviewed.  - FHT appropriate for gestational age at time of  discharge.  - Patient discharged home in stable condition and may return to MAU as needed.    Jacquiline Doe, MSN CNM  01/13/2023, 8:40 PM

## 2023-01-20 ENCOUNTER — Other Ambulatory Visit (HOSPITAL_COMMUNITY)
Admission: RE | Admit: 2023-01-20 | Discharge: 2023-01-20 | Disposition: A | Discharge status: Home / Self Care | Payer: Medicaid Other | Source: Ambulatory Visit | Attending: Obstetrics | Admitting: Obstetrics

## 2023-01-20 DIAGNOSIS — Z01812 Encounter for preprocedural laboratory examination: Secondary | ICD-10-CM | POA: Diagnosis present

## 2023-01-20 LAB — CBC
HCT: 33.4 % — ABNORMAL LOW (ref 36.0–46.0)
Hemoglobin: 11.3 g/dL — ABNORMAL LOW (ref 12.0–15.0)
MCH: 30.2 pg (ref 26.0–34.0)
MCHC: 33.8 g/dL (ref 30.0–36.0)
MCV: 89.3 fL (ref 80.0–100.0)
Platelets: 208 10*3/uL (ref 150–400)
RBC: 3.74 MIL/uL — ABNORMAL LOW (ref 3.87–5.11)
RDW: 13.6 % (ref 11.5–15.5)
WBC: 11.2 10*3/uL — ABNORMAL HIGH (ref 4.0–10.5)
nRBC: 0 % (ref 0.0–0.2)

## 2023-01-20 LAB — TYPE AND SCREEN
ABO/RH(D): B POS
Antibody Screen: NEGATIVE

## 2023-01-21 LAB — RPR: RPR Ser Ql: NONREACTIVE

## 2023-01-21 NOTE — H&P (Signed)
Rebecca Christensen is a 32 y.o. [id number] at 70w6dpresenting for repeat cesarean section. Pt notes no significant contractions. Good fetal movement, No vaginal bleeding, not leaking fluid.  [place] -Dated by LMP consistent with 7-week ultrasound -Intrauterine growth restriction.  Ultrasound at 32-week showed baby less than the 10th percentile.  Baby has had weekly reactive biophysical profile and normal uterine artery Dopplers.  Repeat ultrasound at 35 weeks for baby 5 pounds 0 ounces, 10th percentile.  Given growth restriction plan for delivery in the 37th week. 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  Patient has been on baby aspirin through the pregnancy -Low-lying placenta that resolved -Third trimester vaginal bleeding.  Patient admitted for this episode.  Unexplained vaginal bleeding -Kidney stone in pregnancy.  Passed on its own -GERD.  On Protonix -History of cardiac ablation -Prior cesarean section   Prenatal Transfer Tool  Maternal Diabetes: No Genetic Screening: Normal Maternal Ultrasounds/Referrals: Normal Fetal Ultrasounds or other Referrals:  None Maternal Substance Abuse:  No Significant Maternal Medications:  None Significant Maternal Lab Results: Group B Strep negative     OB History     Gravida  4   Para  2   Term  1   Preterm  1   AB  1   Living  1      SAB  1   IAB  0   Ectopic  0   Multiple  0   Live Births  1          Past Medical History:  Diagnosis Date   Abnormal Pap smear    Dysrhythmia    hx palpitations had echo 2009 ?SVT   History of chicken pox    Kidney calculi    Kidney stones    Lactose intolerance    but does consume dairy   Vaginal Pap smear, abnormal    Past Surgical History:  Procedure Laterality Date   CESAREAN SECTION  01/28/2012   Procedure: CESAREAN SECTION;  Surgeon: JDarlyn Chamber MD;  [facility];  Service: Gynecology;  Laterality: N/A;  Primary cesarean section with delivery of baby  boy at 164 Apgars 9/9.   COLPOSCOPY  2009   ELECTROPHYSIOLOGIC STUDY N/A 11/08/2016   Procedure: SVT Ablation;  Surgeon: GEvans Lance MD;  [facility];  Service: Cardiovascular;  Laterality: N/A;   RENAL ARTERY STENT  10/12   Removed after 1 month   WISDOM TOOTH EXTRACTION     Family History: family history includes Hyperlipidemia in her mother. Social History:  reports that she has never smoked. She has never used smokeless tobacco. She reports that she does not currently use alcohol after a past usage of about 2.0 - 6.0 standard drinks of alcohol per week. She reports that she does not use drugs.  Review of Systems - Negative except anxiety over the pregnancy  Vitals:   01/22/23 0642 01/22/23 0652  BP: 104/69   Pulse:  91  Resp:  16  Temp:  98 F (36.7 C)     Physical Exam:  Gen: well appearing, no distress  Back: no CVAT Abd: gravid, NT, no RUQ pain LE: No edema, equal bilaterally, non-tender  Prenatal labs: ABO, Rh: --/--/B POS (02/12 1024) Antibody: NEG (02/12 1024) Rubella: Immune (08/10 0000) RPR: NON REACTIVE (02/12 1018)  HBsAg: Negative, Negative (08/10 0000)  HIV:  Non-reactive (08/10 0000)  GBS:   Negative 1 hr Glucola 69  Genetic screening normal panorama Anatomy US normal; subsequent growth ultrasounds with growth restriction  CBC    Component Value Date/Time   WBC 11.2 (H) 01/20/2023 1018   RBC 3.74 (L) 01/20/2023 1018   HGB 11.3 (L) 01/20/2023 1018   HGB 12.9 04/25/2017 0859   HCT 33.4 (L) 01/20/2023 1018   HCT 39.5 04/25/2017 0859   PLT 208 01/20/2023 1018   PLT 348 04/25/2017 0859   MCV 89.3 01/20/2023 1018   MCV 86 04/25/2017 0859   MCH 30.2 01/20/2023 1018   MCHC 33.8 01/20/2023 1018   RDW 13.6 01/20/2023 1018   RDW 13.3 04/25/2017 0859   LYMPHSABS 1.7 04/25/2017 0859   MONOABS 738 10/25/2016 1555    EOSABS 0.1 04/25/2017 0859   BASOSABS 0.0 04/25/2017 0859     Assessment/Plan: 32 y.o. [id number] at [redacted]w[redacted]d-Intrauterine growth restriction in the setting of MTHFR homozygous and 25-week intrauterine fetal demise.  Given the above plan for early C-section at the 37th week.  Patient understands risks benefits and risks of prematurity as well as surgical risks.   KAla Dach2/13/2024, 5:08 PM  KAla Dach2/14/2024 8:35 AM

## 2023-01-21 NOTE — Anesthesia Preprocedure Evaluation (Signed)
Anesthesia Evaluation    Reviewed: Allergy & Precautions, H&P , Patient's Chart, lab work & pertinent test results  Airway Mallampati: II  TM Distance: >3 FB Neck ROM: full    Dental  (+) Teeth Intact   Pulmonary    breath sounds clear to auscultation       Cardiovascular (-) dysrhythmias (? SVT, No Meds)  Rhythm:regular Rate:Normal     Neuro/Psych  Headaches    GI/Hepatic   Endo/Other    Renal/GU Renal disease     Musculoskeletal   Abdominal   Peds  Hematology   Anesthesia Other Findings       Reproductive/Obstetrics (+) Pregnancy                             Anesthesia Physical Anesthesia Plan  ASA: 2  Anesthesia Plan: Spinal   Post-op Pain Management:    Induction:   PONV Risk Score and Plan: 2 and Scopolamine patch - Pre-op and Treatment may vary due to age or medical condition  Airway Management Planned: Natural Airway and Simple Face Mask  Additional Equipment: None  Intra-op Plan:   Post-operative Plan:   Informed Consent: I have reviewed the patients History and Physical, chart, labs and discussed the procedure including the risks, benefits and alternatives for the proposed anesthesia with the patient or authorized representative who has indicated his/her understanding and acceptance.     Dental Advisory Given  Plan Discussed with: Anesthesiologist  Anesthesia Plan Comments: (Labs checked- platelets confirmed with RN in room. Fetal heart tracing, per RN, reported to be stable enough for sitting procedure. Marland Kitchen )        Anesthesia Quick Evaluation

## 2023-01-22 ENCOUNTER — Inpatient Hospital Stay (HOSPITAL_COMMUNITY)
Admission: RE | Admit: 2023-01-22 | Discharge: 2023-01-24 | DRG: 787 | Disposition: A | Discharge status: Home / Self Care | Payer: Medicaid Other | Attending: Obstetrics | Admitting: Obstetrics

## 2023-01-22 ENCOUNTER — Encounter (HOSPITAL_COMMUNITY): Payer: Self-pay | Admitting: Obstetrics

## 2023-01-22 ENCOUNTER — Other Ambulatory Visit: Payer: Self-pay

## 2023-01-22 ENCOUNTER — Inpatient Hospital Stay (HOSPITAL_COMMUNITY): Payer: Medicaid Other | Admitting: Anesthesiology

## 2023-01-22 ENCOUNTER — Encounter (HOSPITAL_COMMUNITY)
Admission: RE | Disposition: A | Discharge status: Home / Self Care | Payer: Self-pay | Source: Home / Self Care | Attending: Obstetrics

## 2023-01-22 DIAGNOSIS — O365930 Maternal care for other known or suspected poor fetal growth, third trimester, not applicable or unspecified: Secondary | ICD-10-CM | POA: Diagnosis present

## 2023-01-22 DIAGNOSIS — D62 Acute posthemorrhagic anemia: Secondary | ICD-10-CM | POA: Diagnosis not present

## 2023-01-22 DIAGNOSIS — K219 Gastro-esophageal reflux disease without esophagitis: Secondary | ICD-10-CM | POA: Diagnosis present

## 2023-01-22 DIAGNOSIS — O9962 Diseases of the digestive system complicating childbirth: Secondary | ICD-10-CM | POA: Diagnosis present

## 2023-01-22 DIAGNOSIS — Z8759 Personal history of other complications of pregnancy, childbirth and the puerperium: Secondary | ICD-10-CM | POA: Diagnosis not present

## 2023-01-22 DIAGNOSIS — O9902 Anemia complicating childbirth: Secondary | ICD-10-CM | POA: Diagnosis present

## 2023-01-22 DIAGNOSIS — O365990 Maternal care for other known or suspected poor fetal growth, unspecified trimester, not applicable or unspecified: Secondary | ICD-10-CM | POA: Diagnosis present

## 2023-01-22 DIAGNOSIS — O34211 Maternal care for low transverse scar from previous cesarean delivery: Secondary | ICD-10-CM

## 2023-01-22 DIAGNOSIS — Z87442 Personal history of urinary calculi: Secondary | ICD-10-CM

## 2023-01-22 DIAGNOSIS — Z3A37 37 weeks gestation of pregnancy: Secondary | ICD-10-CM

## 2023-01-22 SURGERY — Surgical Case
Anesthesia: Spinal

## 2023-01-22 MED ORDER — MORPHINE SULFATE (PF) 0.5 MG/ML IJ SOLN
INTRAMUSCULAR | Status: AC
  Filled 2023-01-22: qty 10

## 2023-01-22 MED ORDER — SIMETHICONE 80 MG PO CHEW: 80.0000 mg | CHEWABLE_TABLET | ORAL | Status: DC | PRN

## 2023-01-22 MED ORDER — ERYTHROMYCIN 5 MG/GM OP OINT
TOPICAL_OINTMENT | OPHTHALMIC | Status: AC
  Filled 2023-01-22: qty 1

## 2023-01-22 MED ORDER — PHENYLEPHRINE 80 MCG/ML (10ML) SYRINGE FOR IV PUSH (FOR BLOOD PRESSURE SUPPORT)
PREFILLED_SYRINGE | INTRAVENOUS | Status: AC
  Filled 2023-01-22: qty 10

## 2023-01-22 MED ORDER — SCOPOLAMINE 1 MG/3DAYS TD PT72
1.0000 | MEDICATED_PATCH | TRANSDERMAL | Status: DC
  Administered 2023-01-22: 1.5 mg via TRANSDERMAL

## 2023-01-22 MED ORDER — SENNOSIDES-DOCUSATE SODIUM 8.6-50 MG PO TABS
2.0000 | ORAL_TABLET | ORAL | Status: DC
  Administered 2023-01-23: 2 via ORAL
  Filled 2023-01-22: qty 2

## 2023-01-22 MED ORDER — PHENYLEPHRINE HCL-NACL 20-0.9 MG/250ML-% IV SOLN
INTRAVENOUS | Status: AC
  Filled 2023-01-22: qty 250

## 2023-01-22 MED ORDER — IBUPROFEN 600 MG PO TABS
600.0000 mg | ORAL_TABLET | Freq: Four times a day (QID) | ORAL | Status: DC
  Administered 2023-01-23 – 2023-01-24 (×6): 600 mg via ORAL
  Filled 2023-01-22 (×6): qty 1

## 2023-01-22 MED ORDER — DIBUCAINE (PERIANAL) 1 % EX OINT: 1.0000 | TOPICAL_OINTMENT | CUTANEOUS | Status: DC | PRN

## 2023-01-22 MED ORDER — TETANUS-DIPHTH-ACELL PERTUSSIS 5-2.5-18.5 LF-MCG/0.5 IM SUSY: 0.5000 mL | PREFILLED_SYRINGE | Freq: Once | INTRAMUSCULAR | Status: DC

## 2023-01-22 MED ORDER — ACETAMINOPHEN 500 MG PO TABS
1000.0000 mg | ORAL_TABLET | Freq: Four times a day (QID) | ORAL | Status: DC
  Administered 2023-01-22 – 2023-01-24 (×7): 1000 mg via ORAL
  Filled 2023-01-22 (×7): qty 2

## 2023-01-22 MED ORDER — DEXMEDETOMIDINE HCL IN NACL 80 MCG/20ML IV SOLN
INTRAVENOUS | Status: AC
  Filled 2023-01-22: qty 20

## 2023-01-22 MED ORDER — DIPHENHYDRAMINE HCL 25 MG PO CAPS: 25.0000 mg | ORAL_CAPSULE | Freq: Four times a day (QID) | ORAL | Status: DC | PRN

## 2023-01-22 MED ORDER — DEXAMETHASONE SODIUM PHOSPHATE 10 MG/ML IJ SOLN
INTRAMUSCULAR | Status: DC | PRN
  Administered 2023-01-22: 8 mg via INTRAVENOUS

## 2023-01-22 MED ORDER — MORPHINE SULFATE (PF) 0.5 MG/ML IJ SOLN
INTRAMUSCULAR | Status: DC | PRN
  Administered 2023-01-22: 150 ug via INTRATHECAL

## 2023-01-22 MED ORDER — CEFAZOLIN SODIUM-DEXTROSE 2-4 GM/100ML-% IV SOLN
2.0000 g | INTRAVENOUS | Status: AC
  Administered 2023-01-22: 2 g via INTRAVENOUS

## 2023-01-22 MED ORDER — CEFAZOLIN SODIUM-DEXTROSE 2-4 GM/100ML-% IV SOLN
INTRAVENOUS | Status: AC
  Filled 2023-01-22: qty 100

## 2023-01-22 MED ORDER — KETOROLAC TROMETHAMINE 30 MG/ML IJ SOLN
30.0000 mg | Freq: Four times a day (QID) | INTRAMUSCULAR | Status: AC
  Administered 2023-01-22 (×3): 30 mg via INTRAVENOUS
  Filled 2023-01-22 (×3): qty 1

## 2023-01-22 MED ORDER — FENTANYL CITRATE (PF) 100 MCG/2ML IJ SOLN
INTRAMUSCULAR | Status: DC | PRN
  Administered 2023-01-22: 15 ug via INTRATHECAL

## 2023-01-22 MED ORDER — COCONUT OIL OIL
1.0000 | TOPICAL_OIL | Status: DC | PRN
  Administered 2023-01-23: 1 via TOPICAL

## 2023-01-22 MED ORDER — OXYTOCIN-SODIUM CHLORIDE 30-0.9 UT/500ML-% IV SOLN
2.5000 [IU]/h | INTRAVENOUS | Status: AC
  Administered 2023-01-22: 2.5 [IU]/h via INTRAVENOUS
  Filled 2023-01-22: qty 500

## 2023-01-22 MED ORDER — WITCH HAZEL-GLYCERIN EX PADS: 1.0000 | MEDICATED_PAD | CUTANEOUS | Status: DC | PRN

## 2023-01-22 MED ORDER — OXYCODONE HCL 5 MG PO TABS: 5.0000 mg | ORAL_TABLET | ORAL | Status: DC | PRN

## 2023-01-22 MED ORDER — LACTATED RINGERS IV SOLN: INTRAVENOUS | Status: DC

## 2023-01-22 MED ORDER — OXYTOCIN-SODIUM CHLORIDE 30-0.9 UT/500ML-% IV SOLN
INTRAVENOUS | Status: DC | PRN
  Administered 2023-01-22: 400 mL via INTRAVENOUS

## 2023-01-22 MED ORDER — MENTHOL 3 MG MT LOZG: 1.0000 | LOZENGE | OROMUCOSAL | Status: DC | PRN

## 2023-01-22 MED ORDER — PHENYLEPHRINE HCL-NACL 20-0.9 MG/250ML-% IV SOLN
INTRAVENOUS | Status: DC | PRN
  Administered 2023-01-22: 60 ug/min via INTRAVENOUS

## 2023-01-22 MED ORDER — BUPIVACAINE HCL (PF) 0.25 % IJ SOLN
INTRAMUSCULAR | Status: AC
  Filled 2023-01-22: qty 30

## 2023-01-22 MED ORDER — FENTANYL CITRATE (PF) 100 MCG/2ML IJ SOLN
INTRAMUSCULAR | Status: AC
  Filled 2023-01-22: qty 2

## 2023-01-22 MED ORDER — BUPIVACAINE IN DEXTROSE 0.75-8.25 % IT SOLN
INTRATHECAL | Status: DC | PRN
  Administered 2023-01-22: 1.5 mL via INTRATHECAL

## 2023-01-22 MED ORDER — SODIUM CHLORIDE 0.9 % IR SOLN
Status: DC | PRN
  Administered 2023-01-22: 1

## 2023-01-22 MED ORDER — PRENATAL MULTIVITAMIN CH
1.0000 | ORAL_TABLET | Freq: Every day | ORAL | Status: DC
  Administered 2023-01-23: 1 via ORAL
  Filled 2023-01-22: qty 1

## 2023-01-22 MED ORDER — METOCLOPRAMIDE HCL 5 MG/ML IJ SOLN
INTRAMUSCULAR | Status: AC
  Filled 2023-01-22: qty 2

## 2023-01-22 MED ORDER — ONDANSETRON HCL 4 MG/2ML IJ SOLN
INTRAMUSCULAR | Status: DC | PRN
  Administered 2023-01-22: 4 mg via INTRAVENOUS

## 2023-01-22 MED ORDER — SIMETHICONE 80 MG PO CHEW
80.0000 mg | CHEWABLE_TABLET | Freq: Three times a day (TID) | ORAL | Status: DC
  Administered 2023-01-22 – 2023-01-23 (×5): 80 mg via ORAL
  Filled 2023-01-22 (×5): qty 1

## 2023-01-22 MED ORDER — VITAMIN K1 1 MG/0.5ML IJ SOLN
INTRAMUSCULAR | Status: AC
  Filled 2023-01-22: qty 0.5

## 2023-01-22 MED ORDER — ACETAMINOPHEN 10 MG/ML IV SOLN
INTRAVENOUS | Status: AC
  Filled 2023-01-22: qty 100

## 2023-01-22 MED ORDER — STERILE WATER FOR IRRIGATION IR SOLN
Status: DC | PRN
  Administered 2023-01-22: 1000 mL

## 2023-01-22 MED ORDER — ZOLPIDEM TARTRATE 5 MG PO TABS: 5.0000 mg | ORAL_TABLET | Freq: Every evening | ORAL | Status: DC | PRN

## 2023-01-22 MED ORDER — TRANEXAMIC ACID-NACL 1000-0.7 MG/100ML-% IV SOLN
INTRAVENOUS | Status: AC
  Filled 2023-01-22: qty 100

## 2023-01-22 MED ORDER — SCOPOLAMINE 1 MG/3DAYS TD PT72
MEDICATED_PATCH | TRANSDERMAL | Status: AC
  Filled 2023-01-22: qty 1

## 2023-01-22 MED ORDER — PHENYLEPHRINE HCL (PRESSORS) 10 MG/ML IV SOLN
INTRAVENOUS | Status: DC | PRN
  Administered 2023-01-22: 80 ug via INTRAVENOUS

## 2023-01-22 MED ORDER — ACETAMINOPHEN 10 MG/ML IV SOLN
INTRAVENOUS | Status: DC | PRN
  Administered 2023-01-22: 1000 mg via INTRAVENOUS

## 2023-01-22 MED ORDER — ONDANSETRON HCL 4 MG/2ML IJ SOLN
INTRAMUSCULAR | Status: AC
  Filled 2023-01-22: qty 2

## 2023-01-22 MED ORDER — OXYTOCIN-SODIUM CHLORIDE 30-0.9 UT/500ML-% IV SOLN
INTRAVENOUS | Status: AC
  Filled 2023-01-22: qty 500

## 2023-01-22 MED ORDER — POVIDONE-IODINE 10 % EX SWAB
2.0000 | Freq: Once | CUTANEOUS | Status: AC
  Administered 2023-01-22: 2 via TOPICAL

## 2023-01-22 MED ORDER — DEXAMETHASONE SODIUM PHOSPHATE 4 MG/ML IJ SOLN
INTRAMUSCULAR | Status: AC
  Filled 2023-01-22: qty 2

## 2023-01-22 SURGICAL SUPPLY — 36 items
BENZOIN TINCTURE PRP APPL 2/3 (GAUZE/BANDAGES/DRESSINGS) IMPLANT
CHLORAPREP W/TINT 26 (MISCELLANEOUS) ×2 IMPLANT
CLAMP UMBILICAL CORD (MISCELLANEOUS) ×1 IMPLANT
CLOTH BEACON ORANGE TIMEOUT ST (SAFETY) ×1 IMPLANT
DRSG OPSITE POSTOP 4X10 (GAUZE/BANDAGES/DRESSINGS) ×1 IMPLANT
ELECT REM PT RETURN 9FT ADLT (ELECTROSURGICAL) ×1
ELECTRODE REM PT RTRN 9FT ADLT (ELECTROSURGICAL) ×1 IMPLANT
EXTRACTOR VACUUM M CUP 4 TUBE (SUCTIONS) IMPLANT
GAUZE SPONGE 4X4 12PLY STRL LF (GAUZE/BANDAGES/DRESSINGS) IMPLANT
GLOVE BIO SURGEON STRL SZ 6.5 (GLOVE) ×1 IMPLANT
GLOVE BIOGEL PI IND STRL 7.0 (GLOVE) ×3 IMPLANT
GOWN STRL REUS W/TWL LRG LVL3 (GOWN DISPOSABLE) ×2 IMPLANT
HEMOSTAT ARISTA ABSORB 3G PWDR (HEMOSTASIS) IMPLANT
KIT ABG SYR 3ML LUER SLIP (SYRINGE) IMPLANT
NDL HYPO 25X5/8 SAFETYGLIDE (NEEDLE) IMPLANT
NEEDLE HYPO 22GX1.5 SAFETY (NEEDLE) IMPLANT
NEEDLE HYPO 25X5/8 SAFETYGLIDE (NEEDLE) IMPLANT
NS IRRIG 1000ML POUR BTL (IV SOLUTION) ×1 IMPLANT
PACK C SECTION WH (CUSTOM PROCEDURE TRAY) ×1 IMPLANT
PAD OB MATERNITY 4.3X12.25 (PERSONAL CARE ITEMS) ×1 IMPLANT
SPONGE SURGIFOAM ABS GEL 12-7 (HEMOSTASIS) IMPLANT
STRIP CLOSURE SKIN 1/2X4 (GAUZE/BANDAGES/DRESSINGS) IMPLANT
SUT MON AB 4-0 PS1 27 (SUTURE) ×1 IMPLANT
SUT PLAIN 0 NONE (SUTURE) IMPLANT
SUT PLAIN 2 0 (SUTURE) ×1
SUT PLAIN 2 0 XLH (SUTURE) IMPLANT
SUT PLAIN ABS 2-0 CT1 27XMFL (SUTURE) IMPLANT
SUT VIC AB 0 CT1 36 (SUTURE) ×2 IMPLANT
SUT VIC AB 0 CTX 36 (SUTURE) ×2
SUT VIC AB 0 CTX36XBRD ANBCTRL (SUTURE) ×2 IMPLANT
SUT VIC AB 2-0 CT1 27 (SUTURE) ×1
SUT VIC AB 2-0 CT1 TAPERPNT 27 (SUTURE) ×1 IMPLANT
SYR CONTROL 10ML LL (SYRINGE) IMPLANT
TOWEL OR 17X24 6PK STRL BLUE (TOWEL DISPOSABLE) ×1 IMPLANT
TRAY FOLEY W/BAG SLVR 14FR LF (SET/KITS/TRAYS/PACK) IMPLANT
WATER STERILE IRR 1000ML POUR (IV SOLUTION) ×1 IMPLANT

## 2023-01-22 NOTE — Transfer of Care (Signed)
Immediate Anesthesia Transfer of Care Note  Patient: Rebecca Christensen  Procedure(s) Performed: Repeat CESAREAN SECTION  Patient Location: PACU  Anesthesia Type:Spinal  Level of Consciousness: awake, alert , and oriented  Airway & Oxygen Therapy: Patient Spontanous Breathing  Post-op Assessment: Report given to RN and Post -op Vital signs reviewed and stable  Post vital signs: Reviewed and stable  Last Vitals:  Vitals Value Taken Time  BP 105/61 01/22/23 1008  Temp    Pulse 93 01/22/23 1011  Resp 18 01/22/23 1011  SpO2 95 % 01/22/23 1011  Vitals shown include unvalidated device data.  Last Pain:  Vitals:   01/22/23 1008  TempSrc: (P) Oral  PainSc:          Complications: No notable events documented.

## 2023-01-22 NOTE — Anesthesia Procedure Notes (Signed)
Spinal  Patient location during procedure: OR Start time: 01/22/2023 8:45 AM End time: 01/22/2023 8:50 AM Reason for block: surgical anesthesia Staffing Anesthesiologist: Janeece Riggers, MD Performed by: Janeece Riggers, MD Authorized by: Janeece Riggers, MD   Preanesthetic Checklist Completed: patient identified, IV checked, site marked, risks and benefits discussed, surgical consent, monitors and equipment checked, pre-op evaluation and timeout performed Spinal Block Patient position: sitting Prep: DuraPrep Patient monitoring: heart rate, cardiac monitor, continuous pulse ox and blood pressure Approach: midline Location: L3-4 Injection technique: single-shot Needle Needle type: Sprotte  Needle gauge: 24 G Needle length: 9 cm Assessment Sensory level: T4 Events: CSF return

## 2023-01-22 NOTE — Anesthesia Postprocedure Evaluation (Signed)
Anesthesia Post Note  Patient: Rebecca Christensen  Procedure(s) Performed: Repeat CESAREAN SECTION     Patient location during evaluation: PACU Anesthesia Type: Spinal Level of consciousness: awake and alert Pain management: pain level controlled Vital Signs Assessment: post-procedure vital signs reviewed and stable Respiratory status: spontaneous breathing and respiratory function stable Cardiovascular status: blood pressure returned to baseline and stable Postop Assessment: spinal receding Anesthetic complications: no   No notable events documented.  Last Vitals:  Vitals:   01/22/23 1213 01/22/23 1553  BP: 101/69 100/64  Pulse: 72 70  Resp: 18 18  Temp: 36.7 C 36.6 C  SpO2: 98% 97%    Last Pain:  Vitals:   01/22/23 1553  TempSrc: Oral  PainSc:    Pain Goal:                   Loise Esguerra

## 2023-01-22 NOTE — Progress Notes (Signed)
Set up breastpump and explained how to use and wash it.

## 2023-01-22 NOTE — Op Note (Signed)
01/22/2023  10:00 AM  PATIENT:  Rebecca Christensen  32 y.o. female  PRE-OPERATIVE DIAGNOSIS:  Previous Cesarean Section, Intrauterine Growth Restriction  POST-OPERATIVE DIAGNOSIS:  Previous Cesarean Section, Intrauterine Growth Restriction  PROCEDURE:  Procedure(s) with comments: Repeat CESAREAN SECTION (N/A) - EDD: 02/06/23 Low-transverse cesarean section with 2 layer closure  SURGEON:  Surgeon(s) and Role:    Aloha Gell, MD - Primary  PHYSICIAN ASSISTANT:   ASSISTANTS: Derrell Lolling, CNM  ANESTHESIA:   spinal  EBL: Per nursing notes  BLOOD ADMINISTERED:none  DRAINS: Urinary Catheter (Foley)   LOCAL MEDICATIONS USED:  NONE  SPECIMEN:  Source of Specimen:  Placenta  DISPOSITION OF SPECIMEN:  To labor and delivery for disposal  COUNTS:  YES  TOURNIQUET:  * No tourniquets in log *  DICTATION: .Note written in [place]: Admit to inpatient   PATIENT DISPOSITION:  PACU - hemodynamically stable.   Delay start of Pharmacological VTE agent (>24hrs) due to surgical blood loss or risk of bleeding: yes    Findings:  @BABYSEXEBC$ @ infant,  APGAR (1 MIN): 9   APGAR (5 MINS): 9   APGAR (10 MINS):   Normal uterus, tubes and ovaries, normal placenta. 3VC, clear amniotic fluid  EBL: Per nursing notes cc Antibiotics:   2g Ancef Complications: none  Indications: This is a 32 y.o. year-old, [place] at 62w6dadmitted for repeat cesarean section due to prior C-section, history of 25-week IUFD and intrauterine growth restriction. Risks benefits and alternatives of the procedure were discussed with the patient who agreed to proceed  Procedure:  After informed consent was obtained the patient was taken to the operating room where spinal anesthesia was initiated.  She was prepped and draped in the normal sterile fashion in dorsal supine position with a leftward tilt.  A foley catheter was in place.  A Pfannenstiel skin incision was made 2 cm above the pubic  symphysis in the midline with the scalpel.  Dissection was carried down with the Bovie cautery until the fascia was reached. The fascia was incised in the midline. The incision was extended laterally with the Mayo scissors. The inferior aspect of the fascial incision was grasped with the Coker clamps, elevated up and the underlying rectus muscles were dissected off sharply. The superior aspect of the fascial incision was grasped with the Coker clamps elevated up and the underlying rectus muscles were dissected off sharply.  The peritoneum was entered sharply. The peritoneal incision was extended superiorly and inferiorly with good visualization of the bladder.  The bladder was noted to be adhesed higher to the uterus. the bladder blade was inserted and palpation was done to assess the fetal position and the location of the uterine vessels.  A bladder flap was created the lower segment of the uterus was incised sharply with the scalpel and extended  bluntly in the cephalo-caudal fashion. The infant was grasped, brought to the incision,  rotated and the infant was delivered with fundal pressure. The nose and mouth were bulb suctioned. The cord was clamped and cut after 1 minute delay. The infant was handed off to the waiting pediatrician. The placenta was expressed. The uterus was exteriorized. The uterus was cleared of all clots and debris. The uterine incision was repaired with 0 Vicryl in a running locked fashion.  A second layer of the same suture was used in an imbricating fashion to obtain excellent hemostasis.  The uterus was then returned to the abdomen, the gutters were cleared of  all clots and debris. The uterine incision was reinspected and 2 additional figure-of-eight sutures were needed and then the uterus was found to be hemostatic.  There was a moderate amount of oozing from the bladder flap.  No active bleeders were noted and Arista was placed over the site.  The peritoneum was grasped and closed with  2-0 Vicryl in a running fashion. The cut muscle edges and the underside of the fascia were inspected and small amount of continued bleeding was noted at the inferior edge of the fascial dissection.  A small amount of cautery was used for better control and a place of Gelfoam was placed.  It was then found to be hemostatic. The fascia was closed with 0 Vicryl in a single layer . The subcutaneous tissue was irrigated. Scarpa's layer was closed with a 2-0 plain gut suture. The skin was closed with a 4-0 Monocryl in a single layer. The patient tolerated the procedure well. Sponge lap and needle counts were correct x3 and patient was taken to the recovery room in a stable condition.  [name] 01/22/2023 10:01 AM

## 2023-01-22 NOTE — Brief Op Note (Signed)
01/22/2023  10:00 AM  PATIENT:  Arlyce Harman Bevens  32 y.o. female  PRE-OPERATIVE DIAGNOSIS:  Previous Cesarean Section, Intrauterine Growth Restriction  POST-OPERATIVE DIAGNOSIS:  Previous Cesarean Section, Intrauterine Growth Restriction  PROCEDURE:  Procedure(s) with comments: Repeat CESAREAN SECTION (N/A) - EDD: 02/06/23 Low-transverse cesarean section with 2 layer closure  SURGEON:  Surgeon(s) and Role:    Aloha Gell, MD - Primary  PHYSICIAN ASSISTANT:   ASSISTANTS: Derrell Lolling, CNM  ANESTHESIA:   spinal  EBL: Per nursing notes  BLOOD ADMINISTERED:none  DRAINS: Urinary Catheter (Foley)   LOCAL MEDICATIONS USED:  NONE  SPECIMEN:  Source of Specimen:  Placenta  DISPOSITION OF SPECIMEN:   To labor and delivery for disposal  COUNTS:  YES  TOURNIQUET:  * No tourniquets in log *  DICTATION: .Note written in [place]: Admit to inpatient   PATIENT DISPOSITION:  PACU - hemodynamically stable.   Delay start of Pharmacological VTE agent (>24hrs) due to surgical blood loss or risk of bleeding: yes

## 2023-01-23 DIAGNOSIS — O9902 Anemia complicating childbirth: Secondary | ICD-10-CM | POA: Diagnosis not present

## 2023-01-23 LAB — CBC
HCT: 27.2 % — ABNORMAL LOW (ref 36.0–46.0)
Hemoglobin: 9.6 g/dL — ABNORMAL LOW (ref 12.0–15.0)
MCH: 30.9 pg (ref 26.0–34.0)
MCHC: 35.3 g/dL (ref 30.0–36.0)
MCV: 87.5 fL (ref 80.0–100.0)
Platelets: 177 10*3/uL (ref 150–400)
RBC: 3.11 MIL/uL — ABNORMAL LOW (ref 3.87–5.11)
RDW: 13.3 % (ref 11.5–15.5)
WBC: 15.8 10*3/uL — ABNORMAL HIGH (ref 4.0–10.5)
nRBC: 0 % (ref 0.0–0.2)

## 2023-01-23 MED ORDER — MAGNESIUM OXIDE -MG SUPPLEMENT 400 (240 MG) MG PO TABS
400.0000 mg | ORAL_TABLET | Freq: Every day | ORAL | Status: DC
  Administered 2023-01-23: 400 mg via ORAL
  Filled 2023-01-23: qty 1

## 2023-01-23 MED ORDER — POLYSACCHARIDE IRON COMPLEX 150 MG PO CAPS
150.0000 mg | ORAL_CAPSULE | ORAL | Status: DC
  Administered 2023-01-23: 150 mg via ORAL
  Filled 2023-01-23: qty 1

## 2023-01-23 NOTE — Plan of Care (Signed)

## 2023-01-23 NOTE — Progress Notes (Signed)
      Subjective: POD# 1 Live born female  Birth Weight: 6 lb 5.9 oz (2890 g) APGAR: 9, 9  Newborn Delivery   Birth date/time: 01/22/2023 09:13:00 Delivery type: C-Section, Low Transverse Trial of labor: No C-section categorization: Repeat     Baby name: Garnette Scheuermann Delivering provider: Aloha Gell   Feeding: breast  Pain control at delivery: Spinal   Reports feeling well.  Patient reports tolerating PO.   Breast symptoms:none Pain controlled with  PO meds Denies HA/SOB/C/P/N/V/dizziness. Flatus present. She reports vaginal bleeding as normal, without clots.  She is ambulating, urinating without difficulty.     Objective:   VS:    Vitals:   01/22/23 1553 01/22/23 2100 01/23/23 0020 01/23/23 0515  BP: 100/64 90/63 (!) 86/53 (!) 86/53  Pulse: 70 72 66 77  Resp: 18 16 16 16   Temp: 97.9 F (36.6 C) (!) 97.5 F (36.4 C) 98.1 F (36.7 C) 98.8 F (37.1 C)  TempSrc: Oral Oral Oral Oral  SpO2: 97% 98% 98% 98%  Weight:      Height:         Intake/Output Summary (Last 24 hours) at 01/23/2023 1110 Last data filed at 01/23/2023 0130 Gross per 24 hour  Intake --  Output 1450 ml  Net -1450 ml        Recent Labs    01/23/23 0525  WBC 15.8*  HGB 9.6*  HCT 27.2*  PLT 177     Blood type: --/--/B POS (02/12 1024)  Rubella: Immune (08/10 0000)    Physical Exam:  General: alert, cooperative, and no distress CV: Regular rate and rhythm Resp: clear Abdomen: soft, nontender, normal bowel sounds Incision: clean, dry, and intact Uterine Fundus: firm, below umbilicus, nontender Lochia: minimal Ext: no edema, redness or tenderness in the calves or thighs  Assessment/Plan: 32 y.o.   POD# 1. [id number]                  Principal Problem:   Postpartum care following cesarean delivery 2/14 Active Problems:   IUGR (intrauterine growth restriction) affecting care of mother   Cesarean delivery - repeat   Maternal anemia, with delivery - ABL  - asymptomatic  - started  oral Fe and mag ox  Doing well, stable.               Advance diet as tolerated Encourage rest when baby rests Breastfeeding support Encourage to ambulate Routine post-op care  Juliene Pina, CNM, MSN 01/23/2023, 11:10 AM

## 2023-01-23 NOTE — Lactation Note (Addendum)
This note was copied from a baby's chart. Lactation Consultation Note  Patient Name: Girl Nyshia Leazer [id number] Date: 01/23/2023 Reason for consult: Follow-up assessment;Early term 37-38.6wks;Difficult latch Age:33 hours  LC in to visit with P3 Mom of ET infant delivered by C/Section.  Mom has a history of a fetal demise at 3 wks with her 2nd baby.  !st baby breastfed without any difficulty for 9 months.  Mom has been struggling with latching baby to the breast and has been hand expressing or hand pumping colostrum to feed baby by spoon.  RN set up DEBP and Mom has pumped once.  Offered to assist with STS and latching.  Baby undressed and placed STS in football hold after Mom hand expressed drops onto nipple.  No cueing noted.  Baby's last feeding was about 3 hrs ago, 15 ml of donor breast milk supply.  Placed baby prone on Mom's chest as STS promotes baby cueing.  NT came in to do hearing screen.  Mom agreeable to double pump.  Provided a hand's free pumping band and assisted Mom to pump using 21 mm flanges.  Reviewed importance of disassembling pump parts, washing in bin, rinsing and air drying in separate bin on other side of splash guard.  Mom to place baby STS on her chest after pumping and call for assistance when baby shows feeding readiness.  LC noticed a significant labial frenulum down to gum line where there is a notch.  Palate noted to be high on digital suck assessment. Did not assess for a lingual frenulum as baby was so sleepy and NT in to do hearing screen.  RN aware of plan.  Maternal Data Has patient been taught Hand Expression?: Yes Does the patient have breastfeeding experience prior to this delivery?: Yes How long did the patient breastfeed?: 9 months  Feeding Mother's Current Feeding Choice: Breast Milk and Donor Milk Nipple Type: Slow - flow  LATCH Score Latch: Too sleepy or reluctant, no latch achieved, no sucking elicited.  Audible Swallowing: None  Type  of Nipple: Everted at rest and after stimulation  Comfort (Breast/Nipple): Soft / non-tender  Hold (Positioning): Full assist, staff holds infant at breast  LATCH Score: 4   Lactation Tools Discussed/Used Tools: Pump;Flanges;Bottle;Hands-free pumping top Flange Size: 21 Breast pump type: Double-Electric Breast Pump;Manual Pump Education: Setup, frequency, and cleaning;Milk Storage Reason for Pumping: support milk supply/difficult latch Pumping frequency: Encouraged Mom to pump after each breastfeeding attempt/use of nipple shield/or when supplemented Pumped volume: 0 mL (drops)  Interventions Interventions: Breast feeding basics reviewed;Assisted with latch;Skin to skin;Breast massage;Hand express;Pre-pump if needed;Support pillows;Position options;Hand pump;DEBP;LC Services brochure  Discharge Pump: Hands Free ([employer], submitted a STORK pump request with permission)  Consult Status Consult Status: Follow-up Date: 01/24/23 Follow-up type: In-patient    Broadus John 01/23/2023, 8:51 AM

## 2023-01-24 MED ORDER — IBUPROFEN 600 MG PO TABS: 600.0000 mg | ORAL_TABLET | Freq: Four times a day (QID) | ORAL | 0 refills | Status: AC

## 2023-01-24 MED ORDER — OXYCODONE HCL 5 MG PO TABS: 5.0000 mg | ORAL_TABLET | Freq: Four times a day (QID) | ORAL | 0 refills | Status: AC | PRN

## 2023-01-24 MED ORDER — ACETAMINOPHEN 500 MG PO TABS: 1000.0000 mg | ORAL_TABLET | Freq: Four times a day (QID) | ORAL | 0 refills | Status: AC

## 2023-01-24 MED ORDER — POLYSACCHARIDE IRON COMPLEX 150 MG PO CAPS: 150.0000 mg | ORAL_CAPSULE | ORAL | 1 refills | Status: AC

## 2023-01-24 NOTE — Discharge Summary (Signed)
Postpartum Discharge Summary    Patient Name: Rebecca Christensen DOB: 1991-03-09 MRN: [id number]  Date of admission: 01/22/2023 Delivery date:01/22/2023  Delivering provider: Aloha Gell  Date of discharge: 01/24/2023  Admitting diagnosis: IUGR (intrauterine growth restriction) affecting care of mother [O36.5990] Intrauterine pregnancy: [redacted]w[redacted]d    Secondary diagnosis:  Principal Problem:   Postpartum care following cesarean delivery 2/14 Active Problems:   IUGR (intrauterine growth restriction) affecting care of mother   Cesarean delivery - repeat   Maternal anemia, with delivery - ABL     Discharge diagnosis: Term Pregnancy Delivered                                              Post partum procedures: none Augmentation: N/A Complications: None  Hospital course: Sceduled C/S   32y.o. yo [id number] admitted to the hospital 01/22/2023 for scheduled cesarean section with the following indication:Elective Repeat.Delivery details are as follows:  Membrane Rupture Time/Date: 9:13 AM ,01/22/2023   Delivery Method:C-Section, Low Transverse  Details of operation can be found in separate operative note.  Patient had a postpartum course complicated by none..  She is ambulating, tolerating a regular diet, passing flatus, and urinating well. Patient is discharged home in stable condition on  01/24/23        Newborn Data: Birth date:01/22/2023  Birth time:9:13 AM  Gender:Female  Living status:Living  Apgars:9 ,9  Weight:2890 g     Magnesium Sulfate received: NA BMZ received: NA Rhophylac:N/A MMR:No T-DaP:Given prenatally Flu: N/A Transfusion:No  Physical exam  Vitals:   01/23/23 0515 01/23/23 1429 01/23/23 2136 01/24/23 0541  BP: (!) 86/53 95/66 104/64 96/63  Pulse: 77 69 88 82  Resp: 16 18 18 20  $ Temp: 98.8 F (37.1 C) 98.1 F (36.7 C) 98.1 F (36.7 C) 97.6 F (36.4 C)  TempSrc: Oral Oral Oral   SpO2: 98% 99% 100% 98%  Weight:      Height:        General: alert, cooperative, and no distress Lochia: appropriate Uterine Fundus: firm Incision: Healing well with no significant drainage DVT Evaluation: No evidence of DVT seen on physical exam. Labs: Lab Results  Component Value Date   WBC 15.8 (H) 01/23/2023   HGB 9.6 (L) 01/23/2023   HCT 27.2 (L) 01/23/2023   MCV 87.5 01/23/2023   PLT 177 01/23/2023      Latest Ref Rng & Units 04/25/2017    8:59 AM  CMP  Glucose 65 - 99 mg/dL 81   BUN 6 - 20 mg/dL 12   Creatinine 0.57 - 1.00 mg/dL 0.79   Sodium 134 - 144 mmol/L 140   Potassium 3.5 - 5.2 mmol/L 4.4   Chloride 96 - 106 mmol/L 103   CO2 18 - 29 mmol/L 22   Calcium 8.7 - 10.2 mg/dL 8.8   Total Protein 6.0 - 8.5 g/dL 6.0   Total Bilirubin 0.0 - 1.2 mg/dL 0.3   Alkaline Phos 39 - 117 IU/L 32   AST 0 - 40 IU/L 13   ALT 0 - 32 IU/L 12    Edinburgh Score:    01/23/2023    8:00 AM  Edinburgh Postnatal Depression Scale Screening Tool  I have been able to laugh and see the funny side of things. 0  I have looked forward with enjoyment to things. 0  I have blamed myself unnecessarily when things went wrong. 1  I have been anxious or worried for no good reason. 0  I have felt scared or panicky for no good reason. 1  Things have been getting on top of me. 1  I have been so unhappy that I have had difficulty sleeping. 1  I have felt sad or miserable. 0  I have been so unhappy that I have been crying. 1  The thought of harming myself has occurred to me. 0  Edinburgh Postnatal Depression Scale Total 5      After visit meds:  Allergies as of 01/24/2023       Reactions   Lactose Intolerance (gi) Other (See Comments)   GI upset        Medication List     TAKE these medications    acetaminophen 500 MG tablet Commonly known as: TYLENOL Take 2 tablets (1,000 mg total) by mouth every 6 (six) hours.   aspirin 81 MG chewable tablet Chew 81 mg by mouth in the morning.   ibuprofen 600 MG tablet Commonly known as:  ADVIL Take 1 tablet (600 mg total) by mouth every 6 (six) hours.   iron polysaccharides 150 MG capsule Commonly known as: NIFEREX Take 1 capsule (150 mg total) by mouth every other day. Start taking on: January 25, 2023   oxyCODONE 5 MG immediate release tablet Commonly known as: Oxy IR/ROXICODONE Take 1 tablet (5 mg total) by mouth every 6 (six) hours as needed for moderate pain.   prenatal multivitamin Tabs tablet Take 1 tablet by mouth in the morning.               Discharge Care Instructions  (From admission, onward)           Start     Ordered   01/24/23 0000  Discharge wound care:       Comments: Remove dressing when baby turns 33 days old   01/24/23 1055             Discharge home in stable condition Infant Feeding: Bottle and Breast Infant Disposition:home with mother Discharge instruction: per After Visit Summary and Postpartum booklet. Activity: Advance as tolerated. Pelvic rest for 6 weeks.  Diet: routine diet Anticipated Birth Control: Unsure Postpartum Appointment:6 weeks Additional Postpartum F/U:  as needed Future Appointments:No future appointments. Follow up Visit:  Follow-up Information     Aloha Gell, MD Follow up in 6 week(s).   Specialty: Obstetrics and Gynecology Contact information: [address] [place] [address] [phone]                     01/24/2023 Elveria Royals, MD

## 2023-01-24 NOTE — Progress Notes (Signed)
POD# 2  Elective RC/s at 37+ wks for poor Ob Hx.   Live born female , Birth Weight: 6 lb 5.9 oz (2890 g), APGAR: 9, 9 Feeding: breast  S:  Reports feeling well.  Patient reports tolerating PO.   Breast symptoms:none Pain controlled with  PO meds Denies HA/SOB/C/P/N/V/dizziness. Flatus present. She reports vaginal bleeding as normal, without clots.  She is ambulating, urinating without difficulty.      O:  BP 96/63 (BP Location: Right Arm)   Pulse 82   Temp 97.6 F (36.4 C)   Resp 20   Ht 5' (1.524 m)   Wt 69.4 kg   SpO2 98%   Breastfeeding Unknown   BMI 29.88 kg/m   Physical exam:  A&O x 3, no acute distress. Pleasant HEENT neg Lungs CTA bilat CV RRR, S1S2 normal Abdo soft, non tender, non acute, NABS Extr no edema/ tenderness     Latest Ref Rng & Units 01/23/2023    5:25 AM 01/20/2023   10:18 AM 11/16/2022    2:28 AM  CBC  WBC 4.0 - 10.5 K/uL 15.8  11.2  12.1   Hemoglobin 12.0 - 15.0 g/dL 9.6  11.3  11.1   Hematocrit 36.0 - 46.0 % 27.2  33.4  31.4   Platelets 150 - 400 K/uL 177  208  250     Blood type: --/--/B POS (02/12 1024) Rubella: Immune (08/10 0000)   Assessment/Plan: 32 y.o.   POD# 2. [id number]                  Principal Problem:   Postpartum care following cesarean delivery 2/14 Active Problems:   IUGR (intrauterine growth restriction) affecting care of mother   Cesarean delivery - repeat   Maternal anemia, with delivery - ABL  - asymptomatic  - started oral Fe and mag ox   Discharge home. Post op care/ PP care and warning signs dw pt F/up with Dr Pamala Hurry 6 wks and sooner as needed

## 2023-01-24 NOTE — Lactation Note (Signed)
This note was copied from a baby's chart. Lactation Consultation Note  Patient Name: Rebecca Christensen [id number] Date: 01/24/2023 Reason for consult: Follow-up assessment;Mother's request;Difficult latch;Early term 37-38.6wks;Breastfeeding assistance;Infant weight loss (6.75% WL) Age:33 hours  LC entered the room and the birth parent was attempting to breastfeed the infant.  According to the birth parent, the infant has been sleepy at the breast and has been latching with the nipple shield.  LC assisted the birth parent with putting the infant to the right breast in the cross-cradle without the nipple shield.  LC showed the birth parent how to roll her nipple to get the infant on the breast.  The infant was off and on.  She latched deeply with her lips flanged.  The infant took 6-7 sucks with stimulation before falling asleep.  Some swallows were noted.  LC put 34m of DBM into a curved tip syringe and dripped some into her mouth as the infant fed at the breast.  The infant continued to feed off and on with stimulation.  LC assisted the birth parent with putting the infant to the left breast in the cross-cradle position.  The birth parent used a size #20 NS.  The infant latched with a shallow latch and her bottom lip was tucked in.  LC showed the birth parent how to tug on her chin to flange her bottom lip.  Some milk was in the nipple shield when the infant finished feeding.  The infant was fed 157mof DBM via a bottle.  The birth parent will continue to work with the infant and [facility]  LC encouraged the birth parent to continue to pump until the infant is able to sustain a latch for longer.  The birth parent was given supplementation guidelines.  LC reviewed milk storage, pumping frequency, supplementing, engorgement, mastitis, warning signs, infant I/O, and outpatient services.  All questions were answered.    Infant Feeding Plan:  Breastfeed 8+ times in 24 hours according to feeding cues.  Pump after feedings and feed expressed milk to the infant via a bottle until she starts to stay on the breast for at least 15 min. (The infant must be actively sucking)  Latch without the NS and let the infant try at the breast. If she does not latch, try the NS.  Put the infant to the breast prior to supplementing.  Follow supplementation guidelines to supplement the infant. If she requires more than the birth parent is able to pump, the birth parent knows to supplement with formula in addition to her breast milk.  Watch infant output and call the pediatrician with concerns.  [facility]   *When the infant is staying at the breast and actively feeding for at least 15 min, begin decreasing pumping sessions by 1 session every 3 days. (Consult the pediatrician about when to stop supplementing).   Maternal Data    Feeding Mother's Current Feeding Choice: Breast Milk and Donor Milk  LATCH Score Latch: Repeated attempts needed to sustain latch, nipple held in mouth throughout feeding, stimulation needed to elicit sucking reflex.  Audible Swallowing: A few with stimulation  Type of Nipple: Flat  Comfort (Breast/Nipple): Soft / non-tender  Hold (Positioning): Assistance needed to correctly position infant at breast and maintain latch.  LATCH Score: 6   Lactation Tools Discussed/Used Tools: Nipple Shields Nipple shield size: 20  Interventions Interventions: Breast feeding basics reviewed;Assisted with  latch;Adjust position;Breast compression;Breast massage;Expressed milk;Education  Discharge    Consult Status Consult Status: Follow-up Date: 01/24/23 Follow-up type: In-patient    Lysbeth Penner 01/24/2023, 10:41 AM

## 2023-02-03 ENCOUNTER — Telehealth (HOSPITAL_COMMUNITY): Payer: Self-pay

## 2023-02-03 NOTE — Telephone Encounter (Signed)
Patient did not answer phone call. Voicemail left for patient.   [facility]   02/03/23,1942

## 2024-11-30 ENCOUNTER — Other Ambulatory Visit: Payer: Self-pay | Admitting: Obstetrics

## 2025-02-23 ENCOUNTER — Encounter (HOSPITAL_COMMUNITY): Payer: Self-pay

## 2025-02-23 ENCOUNTER — Inpatient Hospital Stay (HOSPITAL_COMMUNITY): Admit: 2025-02-23 | Admitting: Obstetrics

## 2025-02-23 SURGERY — Surgical Case
Anesthesia: Spinal
